# Patient Record
Sex: Female | Born: 1955 | Race: White | Hispanic: No | Marital: Married | Smoking: Never smoker
Health system: Southern US, Community
[De-identification: ages and names within clinical notes are randomized; demographics above are authoritative.]

## PROBLEM LIST (undated history)

## (undated) DIAGNOSIS — Z923 Personal history of irradiation: Secondary | ICD-10-CM

## (undated) DIAGNOSIS — K219 Gastro-esophageal reflux disease without esophagitis: Secondary | ICD-10-CM

## (undated) DIAGNOSIS — I10 Essential (primary) hypertension: Secondary | ICD-10-CM

## (undated) DIAGNOSIS — M8580 Other specified disorders of bone density and structure, unspecified site: Secondary | ICD-10-CM

## (undated) DIAGNOSIS — L719 Rosacea, unspecified: Secondary | ICD-10-CM

## (undated) DIAGNOSIS — C801 Malignant (primary) neoplasm, unspecified: Secondary | ICD-10-CM

## (undated) DIAGNOSIS — E785 Hyperlipidemia, unspecified: Secondary | ICD-10-CM

## (undated) DIAGNOSIS — K222 Esophageal obstruction: Secondary | ICD-10-CM

## (undated) HISTORY — DX: Gastro-esophageal reflux disease without esophagitis: K21.9

## (undated) HISTORY — PX: BREAST EXCISIONAL BIOPSY: SUR124

## (undated) HISTORY — DX: Essential (primary) hypertension: I10

## (undated) HISTORY — DX: Esophageal obstruction: K22.2

## (undated) HISTORY — DX: Rosacea, unspecified: L71.9

## (undated) HISTORY — DX: Other specified disorders of bone density and structure, unspecified site: M85.80

## (undated) HISTORY — DX: Malignant (primary) neoplasm, unspecified: C80.1

## (undated) HISTORY — PX: BREAST BIOPSY: SHX20

## (undated) HISTORY — DX: Hyperlipidemia, unspecified: E78.5

---

## 2000-05-12 DIAGNOSIS — C801 Malignant (primary) neoplasm, unspecified: Secondary | ICD-10-CM

## 2000-05-12 HISTORY — DX: Malignant (primary) neoplasm, unspecified: C80.1

## 2001-05-12 HISTORY — PX: CHOLECYSTECTOMY: SHX55

## 2001-06-17 ENCOUNTER — Encounter (INDEPENDENT_AMBULATORY_CARE_PROVIDER_SITE_OTHER): Payer: Self-pay | Admitting: Specialist

## 2001-06-17 ENCOUNTER — Inpatient Hospital Stay (HOSPITAL_COMMUNITY): Admission: EM | Admit: 2001-06-17 | Discharge: 2001-06-18 | Payer: Self-pay | Admitting: Emergency Medicine

## 2001-06-17 ENCOUNTER — Encounter: Payer: Self-pay | Admitting: General Surgery

## 2002-03-22 ENCOUNTER — Encounter: Payer: Self-pay | Admitting: Obstetrics and Gynecology

## 2002-03-22 ENCOUNTER — Encounter: Admission: RE | Admit: 2002-03-22 | Discharge: 2002-03-22 | Payer: Self-pay | Admitting: Obstetrics and Gynecology

## 2002-03-22 ENCOUNTER — Encounter (INDEPENDENT_AMBULATORY_CARE_PROVIDER_SITE_OTHER): Payer: Self-pay | Admitting: *Deleted

## 2002-04-11 ENCOUNTER — Encounter: Payer: Self-pay | Admitting: General Surgery

## 2002-04-11 ENCOUNTER — Ambulatory Visit (HOSPITAL_COMMUNITY): Admission: RE | Admit: 2002-04-11 | Discharge: 2002-04-11 | Payer: Self-pay | Admitting: General Surgery

## 2002-04-12 ENCOUNTER — Encounter: Payer: Self-pay | Admitting: General Surgery

## 2002-04-14 ENCOUNTER — Encounter: Payer: Self-pay | Admitting: Obstetrics and Gynecology

## 2002-04-14 ENCOUNTER — Encounter: Admission: RE | Admit: 2002-04-14 | Discharge: 2002-04-14 | Payer: Self-pay | Admitting: Obstetrics and Gynecology

## 2002-04-14 ENCOUNTER — Encounter (INDEPENDENT_AMBULATORY_CARE_PROVIDER_SITE_OTHER): Payer: Self-pay | Admitting: Specialist

## 2002-05-12 HISTORY — PX: BREAST SURGERY: SHX581

## 2002-05-18 ENCOUNTER — Encounter: Admission: RE | Admit: 2002-05-18 | Discharge: 2002-05-18 | Payer: Self-pay | Admitting: General Surgery

## 2002-05-18 ENCOUNTER — Encounter (INDEPENDENT_AMBULATORY_CARE_PROVIDER_SITE_OTHER): Payer: Self-pay | Admitting: *Deleted

## 2002-05-18 ENCOUNTER — Encounter: Payer: Self-pay | Admitting: General Surgery

## 2002-05-18 ENCOUNTER — Ambulatory Visit (HOSPITAL_COMMUNITY): Admission: RE | Admit: 2002-05-18 | Discharge: 2002-05-18 | Payer: Self-pay | Admitting: General Surgery

## 2003-02-21 ENCOUNTER — Encounter: Admission: RE | Admit: 2003-02-21 | Discharge: 2003-02-21 | Payer: Self-pay | Admitting: General Surgery

## 2003-02-21 ENCOUNTER — Encounter: Payer: Self-pay | Admitting: General Surgery

## 2003-04-20 ENCOUNTER — Encounter (HOSPITAL_COMMUNITY): Admission: RE | Admit: 2003-04-20 | Discharge: 2003-07-19 | Payer: Self-pay | Admitting: General Surgery

## 2004-02-22 ENCOUNTER — Encounter: Admission: RE | Admit: 2004-02-22 | Discharge: 2004-02-22 | Payer: Self-pay | Admitting: General Surgery

## 2004-04-16 ENCOUNTER — Encounter: Admission: RE | Admit: 2004-04-16 | Discharge: 2004-04-16 | Payer: Self-pay | Admitting: General Surgery

## 2004-05-24 ENCOUNTER — Ambulatory Visit: Payer: Self-pay | Admitting: Oncology

## 2005-04-01 ENCOUNTER — Encounter: Admission: RE | Admit: 2005-04-01 | Discharge: 2005-04-01 | Payer: Self-pay | Admitting: General Surgery

## 2005-04-26 ENCOUNTER — Encounter: Admission: RE | Admit: 2005-04-26 | Discharge: 2005-04-26 | Payer: Self-pay | Admitting: General Surgery

## 2005-05-15 ENCOUNTER — Ambulatory Visit: Payer: Self-pay | Admitting: Oncology

## 2006-04-06 ENCOUNTER — Encounter: Admission: RE | Admit: 2006-04-06 | Discharge: 2006-04-06 | Payer: Self-pay | Admitting: Oncology

## 2006-04-06 ENCOUNTER — Encounter: Admission: RE | Admit: 2006-04-06 | Discharge: 2006-04-06 | Payer: Self-pay | Admitting: Obstetrics and Gynecology

## 2006-05-11 ENCOUNTER — Ambulatory Visit: Payer: Self-pay | Admitting: Oncology

## 2006-05-15 LAB — CBC WITH DIFFERENTIAL/PLATELET
BASO%: 0.5 % (ref 0.0–2.0)
Basophils Absolute: 0 10*3/uL (ref 0.0–0.1)
EOS%: 2.2 % (ref 0.0–7.0)
Eosinophils Absolute: 0.1 10*3/uL (ref 0.0–0.5)
HCT: 37.9 % (ref 34.8–46.6)
HGB: 12.6 g/dL (ref 11.6–15.9)
LYMPH%: 25.8 % (ref 14.0–48.0)
MCH: 29.8 pg (ref 26.0–34.0)
MCHC: 33.2 g/dL (ref 32.0–36.0)
MCV: 89.6 fL (ref 81.0–101.0)
MONO#: 0.5 10*3/uL (ref 0.1–0.9)
MONO%: 7.9 % (ref 0.0–13.0)
NEUT#: 4.1 10*3/uL (ref 1.5–6.5)
NEUT%: 63.6 % (ref 39.6–76.8)
Platelets: 277 10*3/uL (ref 145–400)
RBC: 4.23 10*6/uL (ref 3.70–5.32)
RDW: 14.4 % (ref 11.3–14.5)
WBC: 6.4 10*3/uL (ref 3.9–10.0)
lymph#: 1.6 10*3/uL (ref 0.9–3.3)

## 2006-05-15 LAB — COMPREHENSIVE METABOLIC PANEL
ALT: 19 U/L (ref 0–35)
AST: 15 U/L (ref 0–37)
Albumin: 4.2 g/dL (ref 3.5–5.2)
Alkaline Phosphatase: 78 U/L (ref 39–117)
BUN: 15 mg/dL (ref 6–23)
CO2: 29 mEq/L (ref 19–32)
Calcium: 9.3 mg/dL (ref 8.4–10.5)
Chloride: 103 mEq/L (ref 96–112)
Creatinine, Ser: 0.91 mg/dL (ref 0.40–1.20)
Glucose, Bld: 113 mg/dL — ABNORMAL HIGH (ref 70–99)
Potassium: 3.7 mEq/L (ref 3.5–5.3)
Sodium: 141 mEq/L (ref 135–145)
Total Bilirubin: 0.4 mg/dL (ref 0.3–1.2)
Total Protein: 7.2 g/dL (ref 6.0–8.3)

## 2006-05-15 LAB — LACTATE DEHYDROGENASE: LDH: 113 U/L (ref 94–250)

## 2007-04-16 ENCOUNTER — Encounter: Admission: RE | Admit: 2007-04-16 | Discharge: 2007-04-16 | Payer: Self-pay | Admitting: Oncology

## 2007-05-19 ENCOUNTER — Ambulatory Visit: Payer: Self-pay | Admitting: Oncology

## 2007-05-21 LAB — COMPREHENSIVE METABOLIC PANEL
ALT: 14 U/L (ref 0–35)
AST: 13 U/L (ref 0–37)
Albumin: 4.2 g/dL (ref 3.5–5.2)
Alkaline Phosphatase: 77 U/L (ref 39–117)
BUN: 11 mg/dL (ref 6–23)
CO2: 26 mEq/L (ref 19–32)
Calcium: 9.4 mg/dL (ref 8.4–10.5)
Chloride: 103 mEq/L (ref 96–112)
Creatinine, Ser: 0.75 mg/dL (ref 0.40–1.20)
Glucose, Bld: 80 mg/dL (ref 70–99)
Potassium: 3.9 mEq/L (ref 3.5–5.3)
Sodium: 139 mEq/L (ref 135–145)
Total Bilirubin: 0.4 mg/dL (ref 0.3–1.2)
Total Protein: 7.4 g/dL (ref 6.0–8.3)

## 2007-05-21 LAB — CBC WITH DIFFERENTIAL/PLATELET
BASO%: 0.3 % (ref 0.0–2.0)
Basophils Absolute: 0 10*3/uL (ref 0.0–0.1)
EOS%: 2.5 % (ref 0.0–7.0)
Eosinophils Absolute: 0.2 10*3/uL (ref 0.0–0.5)
HCT: 40.2 % (ref 34.8–46.6)
HGB: 13.5 g/dL (ref 11.6–15.9)
LYMPH%: 30.8 % (ref 14.0–48.0)
MCH: 30.5 pg (ref 26.0–34.0)
MCHC: 33.6 g/dL (ref 32.0–36.0)
MCV: 90.7 fL (ref 81.0–101.0)
MONO#: 0.5 10*3/uL (ref 0.1–0.9)
MONO%: 7.5 % (ref 0.0–13.0)
NEUT#: 4.2 10*3/uL (ref 1.5–6.5)
NEUT%: 58.9 % (ref 39.6–76.8)
Platelets: 302 10*3/uL (ref 145–400)
RBC: 4.43 10*6/uL (ref 3.70–5.32)
RDW: 13.4 % (ref 11.3–14.5)
WBC: 7.1 10*3/uL (ref 3.9–10.0)
lymph#: 2.2 10*3/uL (ref 0.9–3.3)

## 2007-05-21 LAB — LACTATE DEHYDROGENASE: LDH: 125 U/L (ref 94–250)

## 2008-04-21 ENCOUNTER — Encounter: Admission: RE | Admit: 2008-04-21 | Discharge: 2008-04-21 | Payer: Self-pay | Admitting: Oncology

## 2008-04-26 ENCOUNTER — Encounter: Admission: RE | Admit: 2008-04-26 | Discharge: 2008-04-26 | Payer: Self-pay | Admitting: Oncology

## 2008-05-26 ENCOUNTER — Ambulatory Visit: Payer: Self-pay | Admitting: Oncology

## 2009-04-27 ENCOUNTER — Encounter: Admission: RE | Admit: 2009-04-27 | Discharge: 2009-04-27 | Payer: Self-pay | Admitting: Obstetrics and Gynecology

## 2009-05-16 ENCOUNTER — Ambulatory Visit: Payer: Self-pay | Admitting: Oncology

## 2009-05-18 LAB — CBC WITH DIFFERENTIAL/PLATELET
BASO%: 0.6 % (ref 0.0–2.0)
Basophils Absolute: 0 10*3/uL (ref 0.0–0.1)
EOS%: 1.8 % (ref 0.0–7.0)
Eosinophils Absolute: 0.1 10*3/uL (ref 0.0–0.5)
HCT: 40.3 % (ref 34.8–46.6)
HGB: 13.7 g/dL (ref 11.6–15.9)
LYMPH%: 31.8 % (ref 14.0–49.7)
MCH: 31.6 pg (ref 25.1–34.0)
MCHC: 34 g/dL (ref 31.5–36.0)
MCV: 92.9 fL (ref 79.5–101.0)
MONO#: 0.5 10*3/uL (ref 0.1–0.9)
MONO%: 7.4 % (ref 0.0–14.0)
NEUT#: 3.5 10*3/uL (ref 1.5–6.5)
NEUT%: 58.4 % (ref 38.4–76.8)
Platelets: 270 10*3/uL (ref 145–400)
RBC: 4.34 10*6/uL (ref 3.70–5.45)
RDW: 13.2 % (ref 11.2–14.5)
WBC: 6.1 10*3/uL (ref 3.9–10.3)
lymph#: 1.9 10*3/uL (ref 0.9–3.3)

## 2009-05-18 LAB — COMPREHENSIVE METABOLIC PANEL
ALT: 20 U/L (ref 0–35)
AST: 20 U/L (ref 0–37)
Albumin: 4.3 g/dL (ref 3.5–5.2)
Alkaline Phosphatase: 73 U/L (ref 39–117)
BUN: 12 mg/dL (ref 6–23)
CO2: 25 mEq/L (ref 19–32)
Calcium: 9.3 mg/dL (ref 8.4–10.5)
Chloride: 103 mEq/L (ref 96–112)
Creatinine, Ser: 0.88 mg/dL (ref 0.40–1.20)
Glucose, Bld: 84 mg/dL (ref 70–99)
Potassium: 3.9 mEq/L (ref 3.5–5.3)
Sodium: 141 mEq/L (ref 135–145)
Total Bilirubin: 0.3 mg/dL (ref 0.3–1.2)
Total Protein: 7.2 g/dL (ref 6.0–8.3)

## 2009-05-18 LAB — LACTATE DEHYDROGENASE: LDH: 123 U/L (ref 94–250)

## 2010-05-16 ENCOUNTER — Encounter
Admission: RE | Admit: 2010-05-16 | Discharge: 2010-05-16 | Payer: Self-pay | Source: Home / Self Care | Attending: Oncology | Admitting: Oncology

## 2010-05-17 ENCOUNTER — Encounter
Admission: RE | Admit: 2010-05-17 | Discharge: 2010-05-17 | Payer: Self-pay | Source: Home / Self Care | Attending: Oncology | Admitting: Oncology

## 2010-05-22 ENCOUNTER — Ambulatory Visit: Payer: Self-pay | Admitting: Oncology

## 2010-05-24 LAB — LACTATE DEHYDROGENASE: LDH: 113 U/L (ref 94–250)

## 2010-05-24 LAB — COMPREHENSIVE METABOLIC PANEL
ALT: 16 U/L (ref 0–35)
AST: 16 U/L (ref 0–37)
Albumin: 4.6 g/dL (ref 3.5–5.2)
Alkaline Phosphatase: 73 U/L (ref 39–117)
BUN: 18 mg/dL (ref 6–23)
CO2: 27 mEq/L (ref 19–32)
Calcium: 9.5 mg/dL (ref 8.4–10.5)
Chloride: 103 mEq/L (ref 96–112)
Creatinine, Ser: 0.99 mg/dL (ref 0.40–1.20)
Glucose, Bld: 97 mg/dL (ref 70–99)
Potassium: 3.8 mEq/L (ref 3.5–5.3)
Sodium: 140 mEq/L (ref 135–145)
Total Bilirubin: 0.3 mg/dL (ref 0.3–1.2)
Total Protein: 7.2 g/dL (ref 6.0–8.3)

## 2010-05-24 LAB — CBC WITH DIFFERENTIAL/PLATELET
BASO%: 1.2 % (ref 0.0–2.0)
Basophils Absolute: 0.1 10*3/uL (ref 0.0–0.1)
EOS%: 1.3 % (ref 0.0–7.0)
Eosinophils Absolute: 0.1 10*3/uL (ref 0.0–0.5)
HCT: 39.6 % (ref 34.8–46.6)
HGB: 13.6 g/dL (ref 11.6–15.9)
LYMPH%: 23.7 % (ref 14.0–49.7)
MCH: 32 pg (ref 25.1–34.0)
MCHC: 34.5 g/dL (ref 31.5–36.0)
MCV: 92.7 fL (ref 79.5–101.0)
MONO#: 0.4 10*3/uL (ref 0.1–0.9)
MONO%: 4.9 % (ref 0.0–14.0)
NEUT#: 5.3 10*3/uL (ref 1.5–6.5)
NEUT%: 68.9 % (ref 38.4–76.8)
Platelets: 255 10*3/uL (ref 145–400)
RBC: 4.27 10*6/uL (ref 3.70–5.45)
RDW: 12.9 % (ref 11.2–14.5)
WBC: 7.6 10*3/uL (ref 3.9–10.3)
lymph#: 1.8 10*3/uL (ref 0.9–3.3)

## 2010-06-02 ENCOUNTER — Encounter: Payer: Self-pay | Admitting: Oncology

## 2010-06-21 ENCOUNTER — Encounter (HOSPITAL_BASED_OUTPATIENT_CLINIC_OR_DEPARTMENT_OTHER): Payer: 59 | Admitting: Oncology

## 2010-06-21 DIAGNOSIS — Z853 Personal history of malignant neoplasm of breast: Secondary | ICD-10-CM

## 2010-06-25 ENCOUNTER — Other Ambulatory Visit: Payer: Self-pay | Admitting: Oncology

## 2010-06-25 DIAGNOSIS — Z1231 Encounter for screening mammogram for malignant neoplasm of breast: Secondary | ICD-10-CM

## 2010-09-27 NOTE — H&P (Signed)
[facility]  Patient:    Tina [name], Tina [name] Visit Number: [id number] MRN: [id number]          Service Type: SUR Location: 3W 0343 02 Attending Physician:  Arlis Porta Dictated by:   Adolph Pollack, M.D. Admit Date:  06/17/2001   CC:         Lesle Chris, M.D., [facility] [address]   History and Physical  CHIEF COMPLAINT:  Right upper quadrant pain with nausea.  HISTORY OF PRESENT ILLNESS:  This 55 year old female awoke at 2 oclock this morning with some mild right upper quadrant pain that progressively worsened, felt like it was "boring" through to her back and associated with nausea.  The pain did not let up and she presented six hours later to urgent care for evaluation.  While she was there they sent her over for a stat ultrasound, and it demonstrated thickened gallbladder wall with a large 2 cm gallstone impacted in the neck of the gallbladder.  She was given a shot of Toradol and Phenergan and presented to the emergency department.  She did get some relief from the Toradol and Phenergan but not complete.  She states she had something like this two years ago, but it was self-limited.  She denies fever or chills with jaundice.  PAST MEDICAL HISTORY:  Allergic rhinitis.  PREVIOUS OPERATIONS:  None.  ALLERGIES:  None known.  MEDICATIONS:  Clarinex and birth control pill.  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  She is married and has two children.  No tobacco use.  She occasionally has an alcoholic beverage.  No recreational drug use.  FAMILY HISTORY:  Positive for father who has type 2 diabetes mellitus, coronary artery disease, and hypertension.  Mother had uterine cancer, hypertension, does have cholelithiasis, but it is asymptomatic.  REVIEW OF SYSTEMS:  CARDIOVASCULAR:  No known heart disease, hypertension. PULMONARY:  No COPD, pneumonia, TB.  GASTROINTESTINAL:  No peptic ulcer disease,  hepatitis, diverticulitis.  GENITOURINARY:  No kidney stones. NEUROLOGIC:  No seizure.  HEMATOLOGIC:  No bleeding disorders or previous transfusions.  No DVT.  PHYSICAL EXAMINATION:  GENERAL:  Generally a slightly ill-appearing female, but who is very pleasant and cooperative.  VITAL SIGNS:  Temperature is 98.1 with blood pressure 134/84 and a pulse of 85.  SKIN:  Warm and dry without jaundice.  HEENT:  Eyes:  Extraocular motion is intact.  No icterus.  NECK:  Supple without masses.  CARDIOVASCULAR:  _________ is regular rate and rhythm without murmur.  RESPIRATORY:  Breath sounds are clear.  Respirations _________ .  ABDOMEN:  Soft with right upper quadrant tenderness and guarding.  No palpable masses.  No organomegaly present.  MUSCULOSKELETAL:  Full range of motion with no cyanosis or edema.  LABORATORY DATA:  Liver function tests within normal limits, as is her entire CMET.  Urine pregnancy is negative.  White blood cell count from Dr. Deforest Hoyles office is 9900.  Urinalysis unremarkable.  IMPRESSION:  Acute cholecystitis with early evolution at this time.  PLAN: 1. IV antibiotics. 2. Laparoscopic cholecystectomy.  I did explain procedure and the risks to include, but not limited to, bleeding, infection, common bile duct injury, hepatic injury with bile leak, intestinal injury, and the risk of general anesthesia.  She seems to understand these and agrees to proceed. Dictated by:   Adolph Pollack, M.D. Attending Physician:  Arlis Porta DD:  06/17/01 TD:  06/17/01 Job: [id number]

## 2010-09-27 NOTE — Op Note (Signed)
NAME:  Tina [name], Tina [name]                          ACCOUNT NO.:  [id number]   MEDICAL RECORD NO.:  [id number]                   PATIENT TYPE:  OIB   LOCATION:  2892                                 FACILITY:  [facility]   PHYSICIAN:  Angelia Mould. Derrell Lolling, M.D.             DATE OF BIRTH:  1955/06/16   DATE OF PROCEDURE:  05/18/2002  DATE OF DISCHARGE:  05/18/2002                                 OPERATIVE REPORT   PREOPERATIVE DIAGNOSES:  1. Lobular carcinoma in situ, left breast.  2. Right breast mass, sclerosing papillary lesion.   POSTOPERATIVE DIAGNOSES:  1. Lobular carcinoma in situ, left breast.  2. Right breast mass, sclerosing papillary lesion.   OPERATION PERFORMED:  Excisional biopsy of right breast mass with needle  localization.   SPECIMENS:  Mammogram.   SURGEON:  Angelia Mould. Derrell Lolling, M.D.   INDICATIONS FOR PROCEDURE:  This is a 55 year old white female who initially  was sent to me several weeks ago with a tiny focus of microcalcifications at  the 12 o'clock position of the left breast. Core biopsy of this lesion  showed lobular carcinoma in situ and there was on residual calcifications  following this procedure. The patient is being considered for hormonal  manipulation therapy and she has been advised against any further operation.  She was concerned and wanted further evaluation of her breast. MRI of both  breasts were performed. The MRI of the left breast was normal but the MRI of  the right breast showed a tiny nodule at the 7 o'clock position of the right  breast. Ultrasound guided core biopsy of this by Dr. Rosalie Gums showed a  sclerosing papillary lesion and there was no atypia but there was associated  ductal hyperplasia and the pathologist felt that the entire lesion should be  excised and I agreed. The patient strongly wanted to have both areas  excised, the one on the right and the one on the left. That was scheduled  but when she went for her localization today,  Dr. Rosalie Gums stated that  there was no residual calcifications and there was no marker in the left  breast and so she could not localize the area on the left breast. Therefore  the left breast biopsy was cancelled but she did perform a wide localization  of the right breast lesion at the 7 o'clock position and that localization  was satisfactory.   TECHNIQUE:  Following the wide localization, the patient was brought to [facility]. Films were reviewed. She was taken to the operating room where a  general LMA anesthetic was introduced. The right breast was prepped and  draped in a sterile fashion. The 0.5% Marcaine with epinephrine was used as  a local infiltration anesthetic. I made a radially oriented incision in the  right breast starting at the areolar margin and extending down in the 7  o'clock position. We dissected down into  the breast tissue, isolated the  wire and brought that into the center of the wound. We then dissected around  the wire at the 7 o'clock position dissecting all the breast tissue. We  removed an area about the size of a golf ball but were slightly smaller.  This was marked with metal markers to orient the radiologist and the  pathologist. The specimen was sent for specimen mammography and Dr. Manson Passey  stated that we had removed the entire abnormal area. The specimen was then  sent to pathology. Hemostasis was excellent and assured with electrocautery.  The wound was irrigated with saline. The skin was closed with a running  subcuticular  suture of 4-0 Vicryl and Steri-Strips. Clean bandages were placed and the  patient taken to the recovery room in stable condition. Estimated blood loss  was about 15 cc. Complications none. Sponge, needle and instrument counts  were correct.                                                Angelia Mould. Derrell Lolling, M.D.    HMI/MEDQ  D:  05/18/2002  T:  05/18/2002  Job:  [id number]   cc:   S. Kyra Manges, M.D.  [phone] [address]  [place]  [place] [address]  Fax: [phone]   Genene Churn. Cyndie Chime, M.D.  [address] [facility]  [place]  [place] [address]  Fax: [phone]

## 2010-09-27 NOTE — Op Note (Signed)
[facility]  Patient:    Tina [name], Tina [name] Visit Number: [id number] MRN: [id number]          Service Type: SUR Location: 3W 0343 02 Attending Physician:  Arlis Porta Dictated by:   Adolph Pollack, M.D. Proc. Date: 06/17/01 Admit Date:  06/17/2001 Discharge Date: 06/18/2001                             Operative Report  PREOPERATIVE DIAGNOSIS:  Acute cholecystitis.  POSTOPERATIVE DIAGNOSIS:  Acute cholecystitis.  OPERATION PERFORMED:  Laparoscopic cholecystectomy.  SURGEON:  Adolph Pollack, M.D.  ASSISTANT:  Sheppard Plumber. Earlene Plater, M.D.  ANESTHESIA:  General.  INDICATIONS FOR PROCEDURE:  The patient is a 55 year old female with severe right upper quadrant pain and guarding on exam.  Ultrasound demonstrates a thickened gallbladder wall with a 2 cm gallstone impacted in the neck of the gallbladder.  She now presents to the operating room.  DESCRIPTION OF PROCEDURE:  She was placed supine on the operating table and a general anesthetic was administered.  The abdomen was sterilely prepped and draped.  Local anesthetic was infiltrated in the subumbilical region and a small subumbilical scar was made in the skin and subcutaneous tissue.  The midline fascia was identified and incision made in the midline fascia.  The peritoneal cavity was entered bluntly and under direct vision.  A pursestring suture of 0 Vicryl was placed around the fascial edges.  A Hasson trocar was introduced into the peritoneal cavity and pneumoperitoneum created by insufflation of CO2 gas.  Next, a laparoscope was introduced and no underlying bowel injury was noted. The patient was placed in the appropriate position and an 11 mm incision made in the epigastric area through which a similar sized trocar was placed into the peritoneal cavity.  Two 5 mm incisions were made and two 5 mm trocars placed into the peritoneal cavity.  The gallbladder was acutely inflamed  and distended and was evacuated.  Next, the fundus was able to be grasped and retracted to the right shoulder.  The infundibulum was grasped and then completely mobilized.  The cystic duct was identified and isolated at its junction with the gallbladder.  A window was created around the cystic duct. It was clipped three times proximally, once distally and divided.  The cystic artery was identified, clipped and divided.  The gallbladder was dissected free from the liver bed with electrocautery.  There appeared to be a small accessory bile duct and this was clipped and divided up near the lateral portion of the gallbladder fossa.  Next, the liver bed was irrigated and bleeding points were controlled with the cautery and with a piece of Surgicel.  The irrigation fluid was then evacuated.  No further bleeding and no bile leakage was noted.  The gallbladder was placed in an endopouch bag and removed through the subumbilical port.  The subumbilical fascial defect was closed by tightening up and tying down the pursestring suture.  The rest of the irrigation fluid was then evacuated and all trocars were removed and the pneumoperitoneum released.  The skin incisions were closed with 4-0 Monocryl subcuticular stitches.  Steri-Strips and sterile dressings were applied.  The patient tolerated the procedure well without any apparent complications and was taken to the recovery room in satisfactory condition. Dictated by:   Adolph Pollack, M.D. Attending Physician:  Arlis Porta DD:  06/17/01 TD:  06/18/01 Job: [id number]

## 2011-01-21 ENCOUNTER — Other Ambulatory Visit (INDEPENDENT_AMBULATORY_CARE_PROVIDER_SITE_OTHER): Payer: Self-pay

## 2011-01-21 ENCOUNTER — Telehealth (INDEPENDENT_AMBULATORY_CARE_PROVIDER_SITE_OTHER): Payer: Self-pay

## 2011-01-21 DIAGNOSIS — N6452 Nipple discharge: Secondary | ICD-10-CM

## 2011-01-21 NOTE — Telephone Encounter (Signed)
Pt called c/o dark  Brown nipple d/c right breast. Hx LCIS left breast and hyperplagia of right breast several years ago. Pt called [facility] and they advised she needs order for dx imaging to be placed before they could schedule imaging. Pt advised we will place order.

## 2011-01-27 ENCOUNTER — Ambulatory Visit
Admission: RE | Admit: 2011-01-27 | Discharge: 2011-01-27 | Disposition: A | Discharge status: Home / Self Care | Payer: 59 | Source: Ambulatory Visit | Attending: General Surgery | Admitting: General Surgery

## 2011-01-27 ENCOUNTER — Telehealth (INDEPENDENT_AMBULATORY_CARE_PROVIDER_SITE_OTHER): Payer: Self-pay | Admitting: General Surgery

## 2011-01-27 DIAGNOSIS — N6452 Nipple discharge: Secondary | ICD-10-CM

## 2011-01-27 NOTE — Telephone Encounter (Signed)
LM on patients work and Mobile numbers to call for results of mammo and [place].

## 2011-02-03 ENCOUNTER — Encounter (INDEPENDENT_AMBULATORY_CARE_PROVIDER_SITE_OTHER): Payer: Self-pay | Admitting: General Surgery

## 2011-02-03 ENCOUNTER — Ambulatory Visit (INDEPENDENT_AMBULATORY_CARE_PROVIDER_SITE_OTHER): Payer: 59 | Admitting: General Surgery

## 2011-02-03 VITALS — BP 120/78 | HR 64 | Temp 97.4°F | Resp 16 | Ht 67.0 in | Wt 193.5 lb

## 2011-02-03 DIAGNOSIS — N6452 Nipple discharge: Secondary | ICD-10-CM

## 2011-02-03 DIAGNOSIS — N6459 Other signs and symptoms in breast: Secondary | ICD-10-CM

## 2011-02-03 NOTE — Patient Instructions (Signed)
Your breast exam today is normal. Your mammograms are normal. I think that the drainage that you experienced in the right breast when you pressed on the nipple is a low risk finding. I advise no surgery at this time. I advise mammograms in January 2013. Return to see me if there is an abnormality on mammogram or if you develop spontaneous nipple discharge.

## 2011-02-03 NOTE — Progress Notes (Signed)
Chief Complaint  Patient presents with  . Other    eval of abnormal nipple discharge- right breast painful and draining     HPI Tina [name] is a 55 y.o. female.    This is a very pleasant patient that I have seen in the past. In 2004 she had abnormal breast calcifications. She underwent image guided biopsy of the left breast which showed lobular carcinoma in situ. We were going to excise abnormalities in the right and the left breast, but when she went for needle localization they could not find any abnormality in the left. We abandoned the left breast biopsy. We proceeded with a right breast biopsy which showed florid hyperplasia. I felt she was high risk patient and she has been followed by j Dr. Cyndie Chime since that time. She did receives annual mammograms and  she receives an MRI every other year until recently. There has never been other abnormality.  She recently developed right nipple discharge. She says this occurs only when she would press on the breast and the nipple area. It has not been spontaneous. There has not been any discharge onto her bra.. She does not have any pain. She does not feel a mass.  She went to the [facility] [place] Her mammograms showd no abnormality. She was advised to have followup bilateral mammograms in January. HPI  Past Medical History  Diagnosis Date  . Hypertension   . Breast discharge     dark bloody discharge   . Breast pain     Past Surgical History  Procedure Date  . Cholecystectomy 2003  . Breast surgery 2004    excisionla right breast surgery     Family History  Problem Relation Age of Onset  . Stroke Father 24    Social History History  Substance Use Topics  . Smoking status: Never Smoker   . Smokeless tobacco: Never Used  . Alcohol Use: Yes    No Known Allergies  Current Outpatient Prescriptions  Medication Sig Dispense Refill  . COZAAR 50 MG tablet       . LOVAZA 1 G capsule 4 mg daily.         Review of  Systems Review of Systems  12 system review of systems is performed and is negative except as described above.  Blood pressure 120/78, pulse 64, temperature 97.4 F (36.3 C), resp. rate 16, height 5\' 7"  (1.702 m), weight 193 lb 8 oz (87.771 kg).  Physical Exam Physical Exam  Constitutional: She is oriented to person, place, and time. She appears well-developed and well-nourished. No distress.  HENT:  Head: Normocephalic and atraumatic.  Nose: Nose normal.  Mouth/Throat: No oropharyngeal exudate.  Eyes: Conjunctivae are normal. Pupils are equal, round, and reactive to light. Left eye exhibits no discharge. No scleral icterus.  Neck: Normal range of motion. Neck supple. No JVD present. No tracheal deviation present. No thyromegaly present.  Cardiovascular: Normal rate, regular rhythm, normal heart sounds and intact distal pulses.   No murmur heard. Pulmonary/Chest: Effort normal and breath sounds normal. No respiratory distress. She has no wheezes. She has no rales.    Abdominal: Soft. Bowel sounds are normal. She exhibits no distension and no mass. There is no tenderness. There is no rebound and no guarding.  Musculoskeletal: Normal range of motion. She exhibits no edema and no tenderness.  Lymphadenopathy:    She has no cervical adenopathy.  Neurological: She is alert and oriented to person, place, and time. She exhibits  normal muscle tone. Coordination normal.  Skin: Skin is warm and dry. No rash noted. She is not diaphoretic. No erythema. No pallor.  Psychiatric: She has a normal mood and affect. Her behavior is normal. Judgment and thought content normal.    Data Reviewed  I reviewed her mammograms and the mammogram report.  Assessment    Right nipple discharge, rust colored, nonspontaneous. Low-risk findings considering physical exam mammograms and history.  History lobular carcinoma in situ left breast.  Status post a cholecystectomy.    Plan    The patient was  reassured this is a low-risk finding.  Advised bilateral mammograms in January 2013.  She was advised to return to see me if there is any mammographic abnormality or if she develops recurrent nipple discharge which is spontaneous.       Kesean Serviss M 02/03/2011, 3:27 PM

## 2011-05-16 ENCOUNTER — Other Ambulatory Visit: Payer: 59 | Admitting: Lab

## 2011-05-23 ENCOUNTER — Ambulatory Visit: Payer: 59 | Admitting: Oncology

## 2011-05-28 ENCOUNTER — Ambulatory Visit
Admission: RE | Admit: 2011-05-28 | Discharge: 2011-05-28 | Disposition: A | Discharge status: Home / Self Care | Payer: 59 | Source: Ambulatory Visit | Attending: Oncology | Admitting: Oncology

## 2011-05-28 DIAGNOSIS — Z1231 Encounter for screening mammogram for malignant neoplasm of breast: Secondary | ICD-10-CM

## 2011-07-11 DIAGNOSIS — M8580 Other specified disorders of bone density and structure, unspecified site: Secondary | ICD-10-CM

## 2011-07-11 HISTORY — DX: Other specified disorders of bone density and structure, unspecified site: M85.80

## 2011-08-04 ENCOUNTER — Encounter: Payer: Self-pay | Admitting: Gynecology

## 2011-08-04 ENCOUNTER — Ambulatory Visit (INDEPENDENT_AMBULATORY_CARE_PROVIDER_SITE_OTHER): Payer: 59 | Admitting: Gynecology

## 2011-08-04 VITALS — BP 116/74 | Ht 67.5 in | Wt 185.0 lb

## 2011-08-04 DIAGNOSIS — Z01419 Encounter for gynecological examination (general) (routine) without abnormal findings: Secondary | ICD-10-CM

## 2011-08-04 DIAGNOSIS — Z780 Asymptomatic menopausal state: Secondary | ICD-10-CM

## 2011-08-04 DIAGNOSIS — Z131 Encounter for screening for diabetes mellitus: Secondary | ICD-10-CM

## 2011-08-04 DIAGNOSIS — Z13220 Encounter for screening for lipoid disorders: Secondary | ICD-10-CM

## 2011-08-04 LAB — COMPREHENSIVE METABOLIC PANEL
ALT: 16 U/L (ref 0–35)
AST: 19 U/L (ref 0–37)
Albumin: 4.7 g/dL (ref 3.5–5.2)
Alkaline Phosphatase: 63 U/L (ref 39–117)
BUN: 15 mg/dL (ref 6–23)
CO2: 29 mEq/L (ref 19–32)
Calcium: 10.1 mg/dL (ref 8.4–10.5)
Chloride: 104 mEq/L (ref 96–112)
Creat: 0.82 mg/dL (ref 0.50–1.10)
Glucose, Bld: 93 mg/dL (ref 70–99)
Potassium: 4 mEq/L (ref 3.5–5.3)
Sodium: 141 mEq/L (ref 135–145)
Total Bilirubin: 0.4 mg/dL (ref 0.3–1.2)
Total Protein: 7.5 g/dL (ref 6.0–8.3)

## 2011-08-04 LAB — LIPID PANEL
Cholesterol: 175 mg/dL (ref 0–200)
HDL: 42 mg/dL (ref >39)
LDL Cholesterol: 104 mg/dL — ABNORMAL HIGH (ref 0–99)
Total CHOL/HDL Ratio: 4.2 Ratio
Triglycerides: 146 mg/dL (ref <150)
VLDL: 29 mg/dL (ref 0–40)

## 2011-08-04 LAB — CBC WITH DIFFERENTIAL/PLATELET
Basophils Absolute: 0 10*3/uL (ref 0.0–0.1)
Basophils Relative: 0 % (ref 0–1)
Eosinophils Absolute: 0.1 10*3/uL (ref 0.0–0.7)
Eosinophils Relative: 1 % (ref 0–5)
HCT: 41.6 % (ref 36.0–46.0)
Hemoglobin: 13.9 g/dL (ref 12.0–15.0)
Lymphocytes Relative: 40 % (ref 12–46)
Lymphs Abs: 2.1 10*3/uL (ref 0.7–4.0)
MCH: 30.1 pg (ref 26.0–34.0)
MCHC: 33.4 g/dL (ref 30.0–36.0)
MCV: 90 fL (ref 78.0–100.0)
Monocytes Absolute: 0.4 10*3/uL (ref 0.1–1.0)
Monocytes Relative: 7 % (ref 3–12)
Neutro Abs: 2.8 10*3/uL (ref 1.7–7.7)
Neutrophils Relative %: 52 % (ref 43–77)
Platelets: 255 10*3/uL (ref 150–400)
RBC: 4.62 MIL/uL (ref 3.87–5.11)
RDW: 13.5 % (ref 11.5–15.5)
WBC: 5.3 10*3/uL (ref 4.0–10.5)

## 2011-08-04 NOTE — Patient Instructions (Signed)
Follow up for bone density as scheduled. Follow for lab results. Return in one year for her annual gynecologic exam.

## 2011-08-04 NOTE — Progress Notes (Signed)
Tina [name] Jul 23, 1955 [id number]        56 y.o.  New patient for annual exam.  Former patient of Dr. August Saucer [facility].  Past medical history,surgical history, medications, allergies, family history and social history were all reviewed and documented in the EPIC chart. ROS:  Was performed and pertinent positives and negatives are included in the history.  Exam: Sherrilyn Rist chaperone present Filed Vitals:   08/04/11 1035  BP: 116/74   General appearance  Normal Skin grossly normal Head/Neck normal with no cervical or supraclavicular adenopathy thyroid normal Lungs  clear Cardiac RR, without RMG Abdominal  soft, nontender, without masses, organomegaly or hernia Breasts  examined lying and sitting without masses, retractions, discharge or axillary adenopathy.  Well-healed excision scar right breast Pelvic  Ext/BUS/vagina  normal mild atrophic changes  Cervix  normal    Uterus  anteverted, normal size, shape and contour, midline and mobile nontender   Adnexa  Without masses or tenderness    Anus and perineum  normal   Rectovaginal  normal sphincter tone without palpated masses or tenderness.    Assessment/Plan:  56 y.o. female for annual exam.    1. Doing well from a gynecologic standpoint. No significant hot flushes sweats or other menopausal symptoms. 2. Pap smear. No Pap smear was done today. Her last Pap smear was 2012. She has no history of abnormal Pap smears before with multiple normal records in her chart. We'll plan every 3 year Pap smears and she agrees with this according to current screening guidelines. 3. Mammography. Patient recently had mammogram September 2012. Was evaluated for a darkish nipple discharge per Dr. Claud Kelp and it was felt to be benign. She has had no recurrence. She'll continue with SBE monthly. She will report any further discharge. She does have a history of lobular carcinoma in situ and receives annual mammography and every other year MRIs. Her last MRI  was last year and she'll repeat next year. 4. Colonoscopy. Patient had her colonoscopy last year. We'll repeat it at the recommended interval. 5. Bone density. She had a bone density in 2005. I ordered a repeat now with a maternal history of osteoporosis. Increase calcium vitamin D reviewed. We'll check baseline vitamin D level today. 6. Health maintenance. She normally saw Dr. Lelon Perla for her hypertension follow up and she understands that I do not treat this and she will need to establish care with a primary physician and she agrees to do so. Baseline CBC lipid profile comprehensive metabolic panel vitamin D and urinalysis ordered. She had a normal thyroid panel last year. She will see me in a year or sooner she continues well sooner as needed.    Dara Lords MD, 11:20 AM 08/04/2011

## 2011-08-05 LAB — URINALYSIS W MICROSCOPIC + REFLEX CULTURE
Bacteria, UA: NONE SEEN
Bilirubin Urine: NEGATIVE
Casts: NONE SEEN
Glucose, UA: NEGATIVE mg/dL
Hgb urine dipstick: NEGATIVE
Ketones, ur: NEGATIVE mg/dL
Leukocytes, UA: NEGATIVE
Nitrite: NEGATIVE
Protein, ur: NEGATIVE mg/dL
Specific Gravity, Urine: 1.019 (ref 1.005–1.030)
Squamous Epithelial / LPF: NONE SEEN
Urobilinogen, UA: 0.2 mg/dL (ref 0.0–1.0)
pH: 5.5 (ref 5.0–8.0)

## 2011-08-05 LAB — VITAMIN D 25 HYDROXY (VIT D DEFICIENCY, FRACTURES): Vit D, 25-Hydroxy: 46 ng/mL (ref 30–89)

## 2011-08-07 ENCOUNTER — Ambulatory Visit (INDEPENDENT_AMBULATORY_CARE_PROVIDER_SITE_OTHER): Payer: 59

## 2011-08-07 ENCOUNTER — Encounter: Payer: Self-pay | Admitting: Gynecology

## 2011-08-07 ENCOUNTER — Telehealth: Payer: Self-pay | Admitting: Gynecology

## 2011-08-07 DIAGNOSIS — M8580 Other specified disorders of bone density and structure, unspecified site: Secondary | ICD-10-CM

## 2011-08-07 DIAGNOSIS — M949 Disorder of cartilage, unspecified: Secondary | ICD-10-CM

## 2011-08-07 DIAGNOSIS — Z780 Asymptomatic menopausal state: Secondary | ICD-10-CM

## 2011-08-07 DIAGNOSIS — M899 Disorder of bone, unspecified: Secondary | ICD-10-CM

## 2011-08-07 NOTE — Telephone Encounter (Signed)
Patient informed.  Patient requested copy of labs and report. Mailed to patient.

## 2011-08-07 NOTE — Telephone Encounter (Signed)
Tell patient that her bone density showed a "touch" of osteopenia. Her vitamin D level was normal. Recommend weightbearing exercise which is not only good for her bones but her cardiovascular system and repeat the DEXA in 2 years.

## 2011-08-07 NOTE — Telephone Encounter (Signed)
Lm for patient call.

## 2011-10-19 ENCOUNTER — Ambulatory Visit (INDEPENDENT_AMBULATORY_CARE_PROVIDER_SITE_OTHER): Payer: 59 | Admitting: Family Medicine

## 2011-10-19 VITALS — BP 130/83 | HR 71 | Temp 97.9°F | Resp 16 | Ht 67.0 in | Wt 191.0 lb

## 2011-10-19 DIAGNOSIS — L989 Disorder of the skin and subcutaneous tissue, unspecified: Secondary | ICD-10-CM

## 2011-10-19 DIAGNOSIS — I10 Essential (primary) hypertension: Secondary | ICD-10-CM

## 2011-10-19 MED ORDER — LOSARTAN POTASSIUM 50 MG PO TABS: 50.0000 mg | ORAL_TABLET | Freq: Every day | ORAL | Status: DC

## 2011-10-19 NOTE — Progress Notes (Signed)
Subjective: Patient has a history of hypertension. Her previous physician retired. She established with another OB/GYN, but he did not do very care. Therefore she has done her research and supper, with a Dr. Clelia Croft for August. However she will run out of her medication soon on the Cozaar, so came in for a refill of that. She also takes Flonase for her triglycerides. They were checked in March and were good. She has not a new refill on that between now and seeing her new primary care.  Her other concern is that she has a complex of superficial spider of her those pains are left lateral thigh, but more recently has noticed a dark area about 5 mm in diameter on the proximal aspect of that spider varicosity. She's concerned about that. She does go to Dr. Terri Piedra for her dermatologist, but wants to know if there is something she should pay attention to her not.  Objective: Without nodes thyromegaly. Chest clear. Heart regular without murmurs. No ankle edema. Complexes spider veins on the left lateral thigh, with the lesion proximal that is round with a little bit of inactive appearing border. However this all looks like a palpable vascular your lesion.  Assessment: Hypertension Hyperlipidemia Pigmented lesion left thigh Varicose veins  Plan: Recommend she go ahead and see her dermatologist. Will re-prescribe her blood pressure medicine. Did not do any labs today pending her seeing her new primary care this August.

## 2011-10-19 NOTE — Patient Instructions (Signed)
See Dr. Terri Piedra to check the lesion on the leg.

## 2011-10-20 ENCOUNTER — Other Ambulatory Visit: Payer: Self-pay | Admitting: Dermatology

## 2012-02-01 ENCOUNTER — Ambulatory Visit (INDEPENDENT_AMBULATORY_CARE_PROVIDER_SITE_OTHER): Payer: 59 | Admitting: Physician Assistant

## 2012-02-01 VITALS — BP 128/78 | HR 62 | Temp 97.4°F | Resp 16 | Ht 68.18 in | Wt 193.4 lb

## 2012-02-01 DIAGNOSIS — E785 Hyperlipidemia, unspecified: Secondary | ICD-10-CM | POA: Insufficient documentation

## 2012-02-01 DIAGNOSIS — I10 Essential (primary) hypertension: Secondary | ICD-10-CM | POA: Insufficient documentation

## 2012-02-01 DIAGNOSIS — T63481A Toxic effect of venom of other arthropod, accidental (unintentional), initial encounter: Secondary | ICD-10-CM

## 2012-02-01 DIAGNOSIS — T6391XA Toxic effect of contact with unspecified venomous animal, accidental (unintentional), initial encounter: Secondary | ICD-10-CM

## 2012-02-01 MED ORDER — CEPHALEXIN 250 MG PO CAPS: 250.0000 mg | ORAL_CAPSULE | Freq: Three times a day (TID) | ORAL | Status: DC

## 2012-02-01 NOTE — Progress Notes (Signed)
Subjective:    Patient ID: Tina [name], female    DOB: [birth year], 56 y.o.   MRN: [id number]  HPI This 56 y.o. female presents for evaluation of swelling and redness of the right forearm after a presumed yellow jacket sting two days ago while trimming her mother's shrubs.  She did not see what stung her, but notes there was a nearby hole in the ground that she thinks may have a nest in it.  She marked the area of redness that developed, and remarked it yesterday evening when it spread beyond her marking.  This morning she noted one area distal to the injury, where the redness has progressed.  However, she also notes that overall the redness and swelling is less this morning than last night.  She denies respiratory symptoms.  She has no fever, chills, GI/GU symptoms.  Review of Systems  As above.  Past Medical History  Diagnosis Date  . Hypertension   . Breast discharge     dark bloody discharge   . Breast pain   . Abnormal breast tissue 2005    florid hyperplasia; precancerous tissue  . Osteopenia 07/2011    t score -1.1  . Hyperlipidemia     Past Surgical History  Procedure Date  . Cholecystectomy 2003  . Breast surgery 2004    excisionla right breast surgery     Prior to Admission medications   Medication Sig Start Date End Date Taking? Authorizing Provider  losartan (COZAAR) 50 MG tablet Take 1 tablet (50 mg total) by mouth daily. 10/19/11  Yes Peyton Najjar, MD  LOVAZA 1 G capsule 4 mg daily.  12/05/10  Yes Historical Provider, MD    No Known Allergies  History   Social History  . Marital Status: Married   Occupational History  . Not on file.   Social History Main Topics  . Smoking status: Never Smoker   . Smokeless tobacco: Never Used  . Alcohol Use: Yes     rarely  . Drug Use: No  . Sexually Active: Yes    Birth Control/ Protection: Post-menopausal    Family History  Problem Relation Age of Onset  . Stroke Father 13  . Diabetes Father   .  Hypertension Father   . Heart disease Father     bypass  . Cancer Father     parotid cancer  . Cancer Mother 26    uterine  . Osteoporosis Mother   . Diabetes Brother     type 2  . Cancer Maternal Uncle     colon  . Cancer Paternal Uncle     colon  . Diabetes Son 63    MODY       Objective:   Physical Exam  Blood pressure 128/78, pulse 62, temperature 97.4 F (36.3 C), temperature source Oral, resp. rate 16, height 5' 8.18" (1.732 m), weight 193 lb 6.4 oz (87.726 kg), last menstrual period 05/05/2010, SpO2 100.00%. Body mass index is 29.25 kg/(m^2). Well-developed, well nourished female who is awake, alert and oriented, in NAD. HEENT: Nordic/AT, sclera and conjunctiva are clear.   Lungs:normal effort Extremities: no cyanosis, clubbing. Edema noted surrounding a tiny wound of the forearm, extending about 12 x 6 cm. Skin: warm and dry with erythema surrounding a tiny wound of the forearm, consistent with hymenoptera sting. Non-tender.  No increased warmth.  No drainage.       Assessment & Plan:   1. Insect sting  cephALEXin (KEFLEX) 250 MG capsule  Patient Instructions  Use an OTC antihistamine daily to reduce the reaction and it is ok to apply a topical hydrocortisone cream or ointment.

## 2012-02-01 NOTE — Patient Instructions (Signed)
Use an OTC antihistamine daily to reduce the reaction and it is ok to apply a topical hydrocortisone cream or ointment.

## 2012-04-22 ENCOUNTER — Other Ambulatory Visit: Payer: Self-pay | Admitting: Gynecology

## 2012-04-22 DIAGNOSIS — Z1231 Encounter for screening mammogram for malignant neoplasm of breast: Secondary | ICD-10-CM

## 2012-06-09 ENCOUNTER — Ambulatory Visit
Admission: RE | Admit: 2012-06-09 | Discharge: 2012-06-09 | Disposition: A | Discharge status: Home / Self Care | Payer: 59 | Source: Ambulatory Visit | Attending: Gynecology | Admitting: Gynecology

## 2012-06-09 DIAGNOSIS — Z1231 Encounter for screening mammogram for malignant neoplasm of breast: Secondary | ICD-10-CM

## 2012-08-05 ENCOUNTER — Encounter: Payer: 59 | Admitting: Gynecology

## 2012-08-11 ENCOUNTER — Telehealth: Payer: Self-pay | Admitting: *Deleted

## 2012-08-11 ENCOUNTER — Ambulatory Visit (INDEPENDENT_AMBULATORY_CARE_PROVIDER_SITE_OTHER): Payer: 59 | Admitting: Gynecology

## 2012-08-11 ENCOUNTER — Encounter: Payer: Self-pay | Admitting: Gynecology

## 2012-08-11 VITALS — BP 122/76 | Ht 68.0 in | Wt 182.0 lb

## 2012-08-11 DIAGNOSIS — D0500 Lobular carcinoma in situ of unspecified breast: Secondary | ICD-10-CM

## 2012-08-11 DIAGNOSIS — Z01419 Encounter for gynecological examination (general) (routine) without abnormal findings: Secondary | ICD-10-CM

## 2012-08-11 NOTE — Telephone Encounter (Signed)
Order placed at [facility].

## 2012-08-11 NOTE — Patient Instructions (Signed)
Office will contact you to arrange MRI of the breast. Followup in one year for annual exam.

## 2012-08-11 NOTE — Telephone Encounter (Signed)
Message copied by Aura Camps on Wed Aug 11, 2012 10:33 AM ------      Message from: Dara Lords      Created: Wed Aug 11, 2012 10:17 AM       Schedule MRI of the breast reference lobular carcinoma in situ oncologist recommendation for every other year MRI. Patient due now. ------

## 2012-08-11 NOTE — Progress Notes (Signed)
Tina [name] [birth year] [id number]        57 y.o.  G2P2002 for annual exam.  Doing well without complaints.  Past medical history,surgical history, medications, allergies, family history and social history were all reviewed and documented in the EPIC chart. ROS:  Was performed and pertinent positives and negatives are included in the history.  Exam: Kim assistant Filed Vitals:   08/11/12 0932  BP: 122/76  Height: 5\' 8"  (1.727 m)  Weight: 182 lb (82.555 kg)   General appearance  Normal Skin grossly normal Head/Neck normal with no cervical or supraclavicular adenopathy thyroid normal Lungs  clear Cardiac RR, without RMG Abdominal  soft, nontender, without masses, organomegaly or hernia Breasts  examined lying and sitting without masses, retractions, discharge or axillary adenopathy. Pelvic  Ext/BUS/vagina  normal with mild atrophic changes  Cervix  normal   Uterus  axial to anteverted, normal size, shape and contour, midline and mobile nontender   Adnexa  Without masses or tenderness    Anus and perineum  normal   Rectovaginal  normal sphincter tone without palpated masses or tenderness.    Assessment/Plan:  57 y.o. G16P2002 female for annual exam.   1. Postmenopausal. Doing well without significant symptoms such as hot flashes, night sweats vaginal dryness or dyspareunia. We'll continue to follow. 2. 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 Had been followed by oncology but no longer sees them. Had been recommended to have annual mammography and every other year MRIs. Had mammography January 2014. Patient due for MRI now we'll help her arrange this. SBE monthly reviewed. 3. Osteopenia. DEXA 07/2011 with T score -1.1 FRAX 13%/0.4% plan repeat in several years. Increase calcium vitamin D reviewed along with weight-bearing exercise. 4. Pap smear 2012. No Pap smear done today. No history of abnormal Pap smears. Plan repeat next year at 3 year interval. 5. Colonoscopy 2 years  ago. Repeat at their recommended interval. 6. Health maintenance. The lab work done as it is all done through her primary physician's office who she sees on a regular basis. Followup one year, sooner as needed.    Dara Lords MD, 9:53 AM 08/11/2012

## 2012-08-12 NOTE — Telephone Encounter (Signed)
Appt. 08/25/12 @ 9:30 am

## 2012-08-12 NOTE — Telephone Encounter (Signed)
Spoke with [facility] and they will contact pt to schedule.

## 2012-08-16 ENCOUNTER — Telehealth: Payer: Self-pay

## 2012-08-16 NOTE — Telephone Encounter (Signed)
I called [facility] and notified them of Notification # [phone]  Valid x 45 cal days and exp 09/30/12 for patient's breast MRI.

## 2012-08-25 ENCOUNTER — Ambulatory Visit
Admission: RE | Admit: 2012-08-25 | Discharge: 2012-08-25 | Disposition: A | Discharge status: Home / Self Care | Payer: 59 | Source: Ambulatory Visit | Attending: Gynecology | Admitting: Gynecology

## 2012-08-25 DIAGNOSIS — D0500 Lobular carcinoma in situ of unspecified breast: Secondary | ICD-10-CM

## 2012-08-25 MED ORDER — GADOBENATE DIMEGLUMINE 529 MG/ML IV SOLN
18.0000 mL | Freq: Once | INTRAVENOUS | Status: AC | PRN
  Administered 2012-08-25: 18 mL via INTRAVENOUS

## 2012-09-30 ENCOUNTER — Encounter: Payer: Self-pay | Admitting: Gynecology

## 2012-12-12 ENCOUNTER — Ambulatory Visit (INDEPENDENT_AMBULATORY_CARE_PROVIDER_SITE_OTHER): Payer: 59 | Admitting: Physician Assistant

## 2012-12-12 VITALS — BP 128/72 | HR 64 | Temp 98.2°F | Resp 16 | Ht 68.0 in | Wt 199.0 lb

## 2012-12-12 DIAGNOSIS — L299 Pruritus, unspecified: Secondary | ICD-10-CM

## 2012-12-12 DIAGNOSIS — L255 Unspecified contact dermatitis due to plants, except food: Secondary | ICD-10-CM

## 2012-12-12 MED ORDER — PREDNISONE 20 MG PO TABS: ORAL_TABLET | ORAL | Status: DC

## 2012-12-12 MED ORDER — CLOBETASOL PROPIONATE 0.05 % EX OINT: TOPICAL_OINTMENT | Freq: Two times a day (BID) | CUTANEOUS | Status: DC

## 2012-12-12 NOTE — Progress Notes (Signed)
  Subjective:    Patient ID: Tina [name], female    DOB: 03/23/1956, 57 y.o.   MRN: [id number]  HPI This 57 y.o. female presents for evaluation of 4 days of itchy rash since working in the yard and coming into contact with Rhus species plants. Has used calamine lotion with minimal benefit.  She continues to develop new areas of irritation, now involving the RIGHT arm, LEFT neck, LEFT face and anterior abdominal wall just above the waist.  Past medical history, surgical history, family history, social history and problem list reviewed.   Review of Systems No respiratory symptoms.    Objective:   Physical Exam BP 128/72  Pulse 64  Temp(Src) 98.2 F (36.8 C)  Resp 16  Ht 5\' 8"  (1.727 m)  Wt 199 lb (90.266 kg)  BMI 30.26 kg/m2  SpO2 99%  LMP 05/05/2010 WDWNWF, A&O x 3  Patches and scattered erythematous papules and vesicles, many coalesced in plaques, consistent with contact dermatitis from Rhus species plants.       Assessment & Plan:  Dermatitis due to plants, including poison ivy, sumac, and oak - Plan: predniSONE (DELTASONE) 20 MG tablet, clobetasol ointment (TEMOVATE) 0.05 %  Itching  Anticipatory guidance.  Supportive care.  Fernande Bras, PA-C Certified Physician Assistant [facility]

## 2012-12-12 NOTE — Patient Instructions (Addendum)
Stay cool and dry as best you can. You may use an oral antihistamine in addition if needed.

## 2013-01-13 ENCOUNTER — Ambulatory Visit (INDEPENDENT_AMBULATORY_CARE_PROVIDER_SITE_OTHER): Payer: 59 | Admitting: Family Medicine

## 2013-01-13 VITALS — BP 124/84 | HR 71 | Temp 97.8°F | Resp 16 | Ht 68.0 in | Wt 197.8 lb

## 2013-01-13 DIAGNOSIS — J019 Acute sinusitis, unspecified: Secondary | ICD-10-CM

## 2013-01-13 MED ORDER — AMOXICILLIN-POT CLAVULANATE 875-125 MG PO TABS: 1.0000 | ORAL_TABLET | Freq: Two times a day (BID) | ORAL | Status: DC

## 2013-01-13 NOTE — Progress Notes (Signed)
[facility] [address], [place] [address] [phone]  Date:  01/13/2013   Name:  Tina [name]   DOB:  [birth year]   MRN:  [id number]  PCP:  Kari Baars, MD    Chief Complaint: Facial Pain, Sinus Problem and Otalgia   History of Present Illness:  Tina [name] is a 57 y.o. very pleasant female patient who presents with the following:  She noted pain in the right side of her face, and then left ear pain this morning.  She had a temp of 99 last night.  She has been up since 0430 this am. No cough, no ST.  No sinus congestion.   She is mostly concerned because she is leaving for a trip to [facility] this afternoon. No GI symptoms.   histoty of HTN and high cholesterol  She is a semi- retired Nurse, mental health PA  Patient Active Problem List   Diagnosis Date Noted  . HTN (hypertension) 02/01/2012  . Other and unspecified hyperlipidemia 02/01/2012    Past Medical History  Diagnosis Date  . Hypertension   . Breast discharge     dark bloody discharge   . Breast pain   . Abnormal breast tissue 2005    florid hyperplasia; precancerous tissue  . Osteopenia 07/2011    t score -1.1 FRAX 13%/0.4%  . Hyperlipidemia   . Cancer 2002    Breast cancer-Lobular carcinoma in situ    Past Surgical History  Procedure Laterality Date  . Cholecystectomy  2003  . Breast surgery  2004    excisional right breast surgery     History  Substance Use Topics  . Smoking status: Never Smoker   . Smokeless tobacco: Never Used  . Alcohol Use: Yes     Comment: rarely    Family History  Problem Relation Age of Onset  . Stroke Father 71  . Diabetes Father   . Hypertension Father   . Heart disease Father     bypass  . Cancer Father     parotid cancer  . Cancer Mother 68    uterine  . Osteoporosis Mother   . Diabetes Brother     type 2  . Cancer Maternal Uncle     colon  . Cancer Paternal Uncle     colon  . Diabetes Son 59    MODY  . Asthma Son     No  Known Allergies  Medication list has been reviewed and updated.  Current Outpatient Prescriptions on File Prior to Visit  Medication Sig Dispense Refill  . Calcium Carbonate-Vitamin D (CALCIUM + D PO) Take by mouth.      . Cholecalciferol (VITAMIN D PO) Take by mouth.      . clobetasol ointment (TEMOVATE) 0.05 % Apply topically 2 (two) times daily.  60 g  0  . losartan (COZAAR) 50 MG tablet Take 1 tablet (50 mg total) by mouth daily.  30 tablet  3  . LOVAZA 1 G capsule 4 mg daily.       . Multiple Vitamin (MULTIVITAMIN) tablet Take 1 tablet by mouth daily.      . predniSONE (DELTASONE) 20 MG tablet Take 3 PO QAM x3days, 2 PO QAM x3days, 1 PO QAM x3days  18 tablet  0   No current facility-administered medications on file prior to visit.    Review of Systems:  As per HPI- otherwise negative.   Physical Examination: Filed Vitals:   01/13/13 0809  BP: 124/84  Pulse: 71  Temp: 97.8 F (36.6 C)  Resp: 16   Filed Vitals:   01/13/13 0809  Height: 5\' 8"  (1.727 m)  Weight: 197 lb 12.8 oz (89.721 kg)   Body mass index is 30.08 kg/(m^2). Ideal Body Weight: Weight in (lb) to have BMI = 25: 164.1  GEN: WDWN, NAD, Non-toxic, A & O x 3, overweight HEENT: Atraumatic, Normocephalic. Neck supple. No masses, No LAD.  Bilateral TM wnl, oropharynx normal.  PEERL,EOMI.   Nasal congestion and inflammation Ears and Nose: No external deformity. CV: RRR, No M/G/R. No JVD. No thrill. No extra heart sounds. PULM: CTA B, no wheezes, crackles, rhonchi. No retractions. No resp. distress. No accessory muscle use. EXTR: No c/c/e NEURO Normal gait.  PSYCH: Normally interactive. Conversant. Not depressed or anxious appearing.  Calm demeanor.    Assessment and Plan: Sinusitis, acute - Plan: amoxicillin-clavulanate (AUGMENTIN) 875-125 MG per tablet  Leaving for a trip this afternoon and concerned about illness.  rx for augmentin to hold.  Let me know if not better.    Signed Abbe Amsterdam,  MD

## 2013-01-13 NOTE — Patient Instructions (Addendum)
Have a great trip!  If you are not better in the next couple of days go ahead and start the augmentin.  In the meantime continue to use OTC medications as needed for your discomfort.

## 2013-03-17 ENCOUNTER — Other Ambulatory Visit: Payer: Self-pay

## 2013-05-10 ENCOUNTER — Other Ambulatory Visit: Payer: Self-pay

## 2013-05-10 DIAGNOSIS — Z1231 Encounter for screening mammogram for malignant neoplasm of breast: Secondary | ICD-10-CM

## 2013-06-14 ENCOUNTER — Ambulatory Visit
Admission: RE | Admit: 2013-06-14 | Discharge: 2013-06-14 | Disposition: A | Discharge status: Home / Self Care | Payer: 59 | Source: Ambulatory Visit

## 2013-06-14 DIAGNOSIS — Z1231 Encounter for screening mammogram for malignant neoplasm of breast: Secondary | ICD-10-CM

## 2013-08-18 ENCOUNTER — Encounter: Payer: 59 | Admitting: Gynecology

## 2013-08-25 ENCOUNTER — Ambulatory Visit (INDEPENDENT_AMBULATORY_CARE_PROVIDER_SITE_OTHER): Payer: 59 | Admitting: Gynecology

## 2013-08-25 ENCOUNTER — Other Ambulatory Visit (HOSPITAL_COMMUNITY)
Admission: RE | Admit: 2013-08-25 | Discharge: 2013-08-25 | Disposition: A | Discharge status: Home / Self Care | Payer: 59 | Source: Ambulatory Visit | Attending: Gynecology | Admitting: Gynecology

## 2013-08-25 ENCOUNTER — Encounter: Payer: Self-pay | Admitting: Gynecology

## 2013-08-25 VITALS — BP 120/74 | Ht 68.0 in | Wt 192.0 lb

## 2013-08-25 DIAGNOSIS — Z01419 Encounter for gynecological examination (general) (routine) without abnormal findings: Secondary | ICD-10-CM | POA: Insufficient documentation

## 2013-08-25 DIAGNOSIS — C50919 Malignant neoplasm of unspecified site of unspecified female breast: Secondary | ICD-10-CM

## 2013-08-25 DIAGNOSIS — N952 Postmenopausal atrophic vaginitis: Secondary | ICD-10-CM

## 2013-08-25 DIAGNOSIS — Z1151 Encounter for screening for human papillomavirus (HPV): Secondary | ICD-10-CM | POA: Insufficient documentation

## 2013-08-25 LAB — URINALYSIS W MICROSCOPIC + REFLEX CULTURE
Bacteria, UA: NONE SEEN
Bilirubin Urine: NEGATIVE
Casts: NONE SEEN
Glucose, UA: NEGATIVE mg/dL
Hgb urine dipstick: NEGATIVE
Ketones, ur: NEGATIVE mg/dL
Leukocytes, UA: NEGATIVE
Nitrite: NEGATIVE
Protein, ur: NEGATIVE mg/dL
RBC / HPF: NONE SEEN RBC/hpf (ref <3)
Specific Gravity, Urine: 1.02 (ref 1.005–1.030)
Urobilinogen, UA: 0.2 mg/dL (ref 0.0–1.0)
WBC, UA: NONE SEEN WBC/hpf (ref <3)
pH: 6 (ref 5.0–8.0)

## 2013-08-25 MED ORDER — SCOPOLAMINE 1 MG/3DAYS TD PT72: 1.0000 | MEDICATED_PATCH | TRANSDERMAL | Status: DC

## 2013-08-25 NOTE — Progress Notes (Signed)
Tina [name] Dec 30, 1955 [id number]        58 y.o.  G2P2002 for annual exam.  Doing well. Several issues as below.  Past medical history,surgical history, problem list, medications, allergies, family history and social history were all reviewed and documented as reviewed in the EPIC chart.  ROS:  12 system ROS performed with pertinent positives and negatives included in the history, assessment and plan.  Included Systems: General, HEENT, Neck, Cardiovascular, Pulmonary, Gastrointestinal, Genitourinary, Musculoskeletal, Dermatologic, Endocrine, Hematological, Neurologic, Psychiatric Additional significant findings : Reflux related to diet   Exam: Kim assistant Filed Vitals:   08/25/13 0800  BP: 120/74  Height: 5\' 8"  (1.727 m)  Weight: 192 lb (87.091 kg)   General appearance:  Normal affect, orientation and appearance. Skin: Grossly normal HEENT: Normal without gross oral lesions, cervical or supraclavicular adenopathy. Thyroid normal.  Ears/nose appear normal Lungs:  Clear without wheezing, rales or rhonchi Cardiac: RR, without RMG Abdominal:  Soft, nontender, without masses, guarding, rebound, organomegaly or hernia Breasts:  Examined lying and sitting without masses, retractions, discharge or axillary adenopathy. Well-healed right lumpectomy scar. Pelvic:  Ext/BUS/vagina with atrophic changes  Cervix atrophic. HPV done  Uterus anteverted, normal size, shape and contour, midline and mobile nontender   Adnexa  Without masses or tenderness    Anus and perineum  Normal   Rectovaginal  Normal sphincter tone without palpated masses or tenderness.    Assessment/Plan:  58 y.o. G60P2002 female for annual exam.   1. Postmenopausal/atrophic genital changes. No significant symptoms of hot flushes, night sweats, vaginal dryness, dyspareunia. No vaginal bleeding. Will continue to monitor. Report any vaginal bleeding. 2. 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 Exam NED.  Mammography 06/2013. MRI 2014 with planned repeat every other year. Continue with annual mammography. SBE monthly reviewed. 3. Osteopenia. DEXA 07/2011 T score -1.1. FRAX 13%/0.4%. Options to repeat this year at 2 year interval for trending or wait one or 2 years discussed. Patient wants to wait until next year. Increase calcium vitamin D and weightbearing exercise. 4. Pap smear 2012. Pap/HPV today. No history of abnormal Pap smears previously. Repeated 3-5 year interval assuming normal. 5. Colonoscopy 2011. Planned repeat at 5 year interval. 6. Health maintenance. No blood work done as this is done through her primary physician's office. Followup one year, sooner as needed.   Note: This document was prepared with digital dictation and possible smart phrase technology. Any transcriptional errors that result from this process are unintentional.   Anastasio Auerbach MD, 8:28 AM 08/25/2013

## 2013-08-25 NOTE — Addendum Note (Signed)
Addended by: Nelva Nay on: 08/25/2013 08:40 AM   Modules accepted: Orders

## 2013-08-25 NOTE — Patient Instructions (Signed)
Followup in one year for annual exam, sooner as needed.  Scopolamine skin patches What is this medicine? SCOPOLAMINE (skoe POL a meen) is used to prevent nausea and vomiting caused by motion sickness, anesthesia and surgery. This medicine may be used for other purposes; ask your health care provider or pharmacist if you have questions. COMMON BRAND NAME(S): Transderm Scop What should I tell my health care provider before I take this medicine? They need to know if you have any of these conditions: -glaucoma -kidney or liver disease -an unusual or allergic reaction (especially skin allergy) to scopolamine, atropine, other medicines, foods, dyes, or preservatives -pregnant or trying to get pregnant -breast-feeding How should I use this medicine? This medicine is for external use only. Follow the directions on the prescription label. One patch contains enough medicine to prevent motion sickness for up to 3 days. Apply the patch at least 4 hours before you need it and only wear one disc at a time. Choose an area behind the ear, that is clean, dry, hairless and free from any cuts or irritation. Wipe the area with a clean dry tissue. Peel off the plastic backing of the skin patch, trying not to touch the adhesive side with your hands. Do not cut the patches. Firmly apply to the area you have chosen, with the metallic side of the patch to the skin and the tan-colored side showing. Once firmly in place, wash your hands well with soap and water. Remove the disc after 3 days, or sooner if you no longer need it. After removing the patch, wash your hands and the area behind your ear thoroughly with soap and water. The patch will still contain some medicine after use. To avoid accidental contact or ingestion by children or pets, fold the used patch in half with the sticky side together and throw away in the trash out of the reach of children and pets. If you need to use a second patch after you remove the first,  place it behind the other ear.  Talk to your pediatrician regarding the use of this medicine in children. Special care may be needed. Overdosage: If you think you have taken too much of this medicine contact a poison control center or emergency room at once. NOTE: This medicine is only for you. Do not share this medicine with others. What if I miss a dose? Make sure you apply the patch at least 4 hours before you need it. You can apply it the night before traveling. What may interact with this medicine? -benztropine -bethanechol -medicines for anxiety or sleeping problems like diazepam or temazepam -medicines for hay fever and other allergies -medicines for mental depression -muscle relaxants This list may not describe all possible interactions. Give your health care provider a list of all the medicines, herbs, non-prescription drugs, or dietary supplements you use. Also tell them if you smoke, drink alcohol, or use illegal drugs. Some items may interact with your medicine. What should I watch for while using this medicine? Keep the patch dry, if possible, to prevent it from falling off. Limited contact with water, however, as in bathing or swimming, will not affect the system. If the patch falls off, throw it away and put a new one behind the other ear. You may get drowsy or dizzy. Do not drive, use machinery, or do anything that needs mental alertness until you know how this medicine affects you. Do not stand or sit up quickly, especially if you are an older patient.  This reduces the risk of dizzy or fainting spells. Alcohol may interfere with the effect of this medicine. Avoid alcoholic drinks. Your mouth may get dry. Chewing sugarless gum or sucking hard candy, and drinking plenty of water may help. Contact your doctor if the problem does not go away or is severe. This medicine may cause dry eyes and blurred vision. If you wear contact lenses you may feel some discomfort. Lubricating drops may  help. See your eye doctor if the problem does not go away or is severe. If you are going to have a magnetic resonance imaging (MRI) procedure, tell your MRI technician if you have this patch on your body. It must be removed before a MRI. What side effects may I notice from receiving this medicine? Side effects that you should report to your doctor or health care professional as soon as possible: -agitation, nervousness, confusion -blurred vision and other eye problems -dizziness, drowsiness -eye pain or redness in the whites of the eye -hallucinations -pain or difficulty passing urine -skin rash, itching -vomiting Side effects that usually do not require medical attention (report to your doctor or health care professional if they continue or are bothersome): -headache -nausea This list may not describe all possible side effects. Call your doctor for medical advice about side effects. You may report side effects to FDA at 1-800-FDA-1088. Where should I keep my medicine? Keep out of the reach of children. Store at room temperature between 20 and 25 degrees C (68 and 77 degrees F). Throw away any unused medicine after the expiration date. When you remove a patch, fold it and throw it in the trash as described above. NOTE: This sheet is a summary. It may not cover all possible information. If you have questions about this medicine, talk to your doctor, pharmacist, or health care provider.  2014, Elsevier/Gold Standard. (2011-09-25 13:31:48)

## 2014-03-13 ENCOUNTER — Encounter: Payer: Self-pay | Admitting: Gynecology

## 2014-05-17 ENCOUNTER — Encounter: Payer: Self-pay | Admitting: Gynecology

## 2014-05-17 ENCOUNTER — Other Ambulatory Visit: Payer: Self-pay

## 2014-05-17 DIAGNOSIS — Z1231 Encounter for screening mammogram for malignant neoplasm of breast: Secondary | ICD-10-CM

## 2014-05-18 ENCOUNTER — Telehealth: Payer: Self-pay | Admitting: *Deleted

## 2014-05-18 DIAGNOSIS — D0500 Lobular carcinoma in situ of unspecified breast: Secondary | ICD-10-CM

## 2014-05-18 NOTE — Telephone Encounter (Signed)
Order placed for M[facility] # given to pt to contact them to schedule.

## 2014-05-18 NOTE — Telephone Encounter (Signed)
-----   Message from Sandi Mariscal, [place] sent at 05/18/2014  9:18 AM EST ----- Regarding: MRI [facility], Per Dr. Loetta Rough "Help patient arrange for her breast MRI. Re: History of lobular carcinoma in situ."  Thanks!

## 2014-05-18 NOTE — Telephone Encounter (Signed)
appointment on 06/21/13 @ 1:00pm

## 2014-05-18 NOTE — Telephone Encounter (Signed)
Help patient arrange for her breast MRI.   Re: History of lobular carcinoma in situ

## 2014-06-15 ENCOUNTER — Ambulatory Visit
Admission: RE | Admit: 2014-06-15 | Discharge: 2014-06-15 | Disposition: A | Discharge status: Home / Self Care | Payer: 59 | Source: Ambulatory Visit

## 2014-06-15 DIAGNOSIS — Z1231 Encounter for screening mammogram for malignant neoplasm of breast: Secondary | ICD-10-CM

## 2014-06-19 ENCOUNTER — Telehealth: Payer: Self-pay

## 2014-06-19 NOTE — Telephone Encounter (Signed)
I spoke with Janett Billow (?) at [facility] and received prior auth for patient's Breast MRI scheduled 06/21/14.  Josem Kaufmann [phone] and it expires on 08/03/14.

## 2014-06-21 ENCOUNTER — Ambulatory Visit: Payer: Self-pay

## 2014-06-21 ENCOUNTER — Ambulatory Visit
Admission: RE | Admit: 2014-06-21 | Discharge: 2014-06-21 | Disposition: A | Discharge status: Home / Self Care | Payer: 59 | Source: Ambulatory Visit | Attending: Gynecology | Admitting: Gynecology

## 2014-06-21 DIAGNOSIS — D0500 Lobular carcinoma in situ of unspecified breast: Secondary | ICD-10-CM

## 2014-06-21 MED ORDER — GADOBENATE DIMEGLUMINE 529 MG/ML IV SOLN
18.0000 mL | Freq: Once | INTRAVENOUS | Status: AC | PRN
  Administered 2014-06-21: 18 mL via INTRAVENOUS

## 2014-09-14 ENCOUNTER — Encounter: Payer: Self-pay | Admitting: Gynecology

## 2014-09-14 ENCOUNTER — Ambulatory Visit (INDEPENDENT_AMBULATORY_CARE_PROVIDER_SITE_OTHER): Payer: 59 | Admitting: Gynecology

## 2014-09-14 VITALS — BP 112/70 | Ht 70.0 in | Wt 191.0 lb

## 2014-09-14 DIAGNOSIS — M8580 Other specified disorders of bone density and structure, unspecified site: Secondary | ICD-10-CM

## 2014-09-14 DIAGNOSIS — N952 Postmenopausal atrophic vaginitis: Secondary | ICD-10-CM | POA: Diagnosis not present

## 2014-09-14 DIAGNOSIS — Z01419 Encounter for gynecological examination (general) (routine) without abnormal findings: Secondary | ICD-10-CM

## 2014-09-14 NOTE — Patient Instructions (Signed)
Follow up for bone density as scheduled.  You may obtain a copy of any labs that were done today by logging onto MyChart as outlined in the instructions provided with your AVS (after visit summary). The office will not call with normal lab results but certainly if there are any significant abnormalities then we will contact you.   Health Maintenance, Female A healthy lifestyle and preventative care can promote health and wellness.  Maintain regular health, dental, and eye exams.  Eat a healthy diet. Foods like vegetables, fruits, whole grains, low-fat dairy products, and lean protein foods contain the nutrients you need without too many calories. Decrease your intake of foods high in solid fats, added sugars, and salt. Get information about a proper diet from your caregiver, if necessary.  Regular physical exercise is one of the most important things you can do for your health. Most adults should get at least 150 minutes of moderate-intensity exercise (any activity that increases your heart rate and causes you to sweat) each week. In addition, most adults need muscle-strengthening exercises on 2 or more days a week.   Maintain a healthy weight. The body mass index (BMI) is a screening tool to identify possible weight problems. It provides an estimate of body fat based on height and weight. Your caregiver can help determine your BMI, and can help you achieve or maintain a healthy weight. For adults 20 years and older:  A BMI below 18.5 is considered underweight.  A BMI of 18.5 to 24.9 is normal.  A BMI of 25 to 29.9 is considered overweight.  A BMI of 30 and above is considered obese.  Maintain normal blood lipids and cholesterol by exercising and minimizing your intake of saturated fat. Eat a balanced diet with plenty of fruits and vegetables. Blood tests for lipids and cholesterol should begin at age 20 and be repeated every 5 years. If your lipid or cholesterol levels are high, you are over  50, or you are a high risk for heart disease, you may need your cholesterol levels checked more frequently.Ongoing high lipid and cholesterol levels should be treated with medicines if diet and exercise are not effective.  If you smoke, find out from your caregiver how to quit. If you do not use tobacco, do not start.  Lung cancer screening is recommended for adults aged 55 80 years who are at high risk for developing lung cancer because of a history of smoking. Yearly low-dose computed tomography (CT) is recommended for people who have at least a 30-pack-year history of smoking and are a current smoker or have quit within the past 15 years. A pack year of smoking is smoking an average of 1 pack of cigarettes a day for 1 year (for example: 1 pack a day for 30 years or 2 packs a day for 15 years). Yearly screening should continue until the smoker has stopped smoking for at least 15 years. Yearly screening should also be stopped for people who develop a health problem that would prevent them from having lung cancer treatment.  If you are pregnant, do not drink alcohol. If you are breastfeeding, be very cautious about drinking alcohol. If you are not pregnant and choose to drink alcohol, do not exceed 1 drink per day. One drink is considered to be 12 ounces (355 mL) of beer, 5 ounces (148 mL) of wine, or 1.5 ounces (44 mL) of liquor.  Avoid use of street drugs. Do not share needles with anyone. Ask for help   if you need support or instructions about stopping the use of drugs.  High blood pressure causes heart disease and increases the risk of stroke. Blood pressure should be checked at least every 1 to 2 years. Ongoing high blood pressure should be treated with medicines, if weight loss and exercise are not effective.  If you are 55 to 61 years old, ask your caregiver if you should take aspirin to prevent strokes.  Diabetes screening involves taking a blood sample to check your fasting blood sugar level.  This should be done once every 3 years, after age 45, if you are within normal weight and without risk factors for diabetes. Testing should be considered at a younger age or be carried out more frequently if you are overweight and have at least 1 risk factor for diabetes.  Breast cancer screening is essential preventative care for women. You should practice "breast self-awareness." This means understanding the normal appearance and feel of your breasts and may include breast self-examination. Any changes detected, no matter how small, should be reported to a caregiver. Women in their 20s and 30s should have a clinical breast exam (CBE) by a caregiver as part of a regular health exam every 1 to 3 years. After age 40, women should have a CBE every year. Starting at age 40, women should consider having a mammogram (breast X-ray) every year. Women who have a family history of breast cancer should talk to their caregiver about genetic screening. Women at a high risk of breast cancer should talk to their caregiver about having an MRI and a mammogram every year.  Breast cancer gene (BRCA)-related cancer risk assessment is recommended for women who have family members with BRCA-related cancers. BRCA-related cancers include breast, ovarian, tubal, and peritoneal cancers. Having family members with these cancers may be associated with an increased risk for harmful changes (mutations) in the breast cancer genes BRCA1 and BRCA2. Results of the assessment will determine the need for genetic counseling and BRCA1 and BRCA2 testing.  The Pap test is a screening test for cervical cancer. Women should have a Pap test starting at age 21. Between ages 21 and 29, Pap tests should be repeated every 2 years. Beginning at age 30, you should have a Pap test every 3 years as long as the past 3 Pap tests have been normal. If you had a hysterectomy for a problem that was not cancer or a condition that could lead to cancer, then you no  longer need Pap tests. If you are between ages 65 and 70, and you have had normal Pap tests going back 10 years, you no longer need Pap tests. If you have had past treatment for cervical cancer or a condition that could lead to cancer, you need Pap tests and screening for cancer for at least 20 years after your treatment. If Pap tests have been discontinued, risk factors (such as a new sexual partner) need to be reassessed to determine if screening should be resumed. Some women have medical problems that increase the chance of getting cervical cancer. In these cases, your caregiver may recommend more frequent screening and Pap tests.  The human papillomavirus (HPV) test is an additional test that may be used for cervical cancer screening. The HPV test looks for the virus that can cause the cell changes on the cervix. The cells collected during the Pap test can be tested for HPV. The HPV test could be used to screen women aged 30 years and older, and   should be used in women of any age who have unclear Pap test results. After the age of 30, women should have HPV testing at the same frequency as a Pap test.  Colorectal cancer can be detected and often prevented. Most routine colorectal cancer screening begins at the age of 50 and continues through age 75. However, your caregiver may recommend screening at an earlier age if you have risk factors for colon cancer. On a yearly basis, your caregiver may provide home test kits to check for hidden blood in the stool. Use of a small camera at the end of a tube, to directly examine the colon (sigmoidoscopy or colonoscopy), can detect the earliest forms of colorectal cancer. Talk to your caregiver about this at age 50, when routine screening begins. Direct examination of the colon should be repeated every 5 to 10 years through age 75, unless early forms of pre-cancerous polyps or small growths are found.  Hepatitis C blood testing is recommended for all people born from  1945 through 1965 and any individual with known risks for hepatitis C.  Practice safe sex. Use condoms and avoid high-risk sexual practices to reduce the spread of sexually transmitted infections (STIs). Sexually active women aged 25 and younger should be checked for Chlamydia, which is a common sexually transmitted infection. Older women with new or multiple partners should also be tested for Chlamydia. Testing for other STIs is recommended if you are sexually active and at increased risk.  Osteoporosis is a disease in which the bones lose minerals and strength with aging. This can result in serious bone fractures. The risk of osteoporosis can be identified using a bone density scan. Women ages 65 and over and women at risk for fractures or osteoporosis should discuss screening with their caregivers. Ask your caregiver whether you should be taking a calcium supplement or vitamin D to reduce the rate of osteoporosis.  Menopause can be associated with physical symptoms and risks. Hormone replacement therapy is available to decrease symptoms and risks. You should talk to your caregiver about whether hormone replacement therapy is right for you.  Use sunscreen. Apply sunscreen liberally and repeatedly throughout the day. You should seek shade when your shadow is shorter than you. Protect yourself by wearing long sleeves, pants, a wide-brimmed hat, and sunglasses year round, whenever you are outdoors.  Notify your caregiver of new moles or changes in moles, especially if there is a change in shape or color. Also notify your caregiver if a mole is larger than the size of a pencil eraser.  Stay current with your immunizations. Document Released: 11/11/2010 Document Revised: 08/23/2012 Document Reviewed: 11/11/2010 Exit[employer] Patient Information 2014 [employer].   

## 2014-09-14 NOTE — Progress Notes (Signed)
Tina [name] June 30, 1955 [id number]        58 y.o.  G2P2002 for annual exam.  Doing well without complaints.  Past medical history,surgical history, problem list, medications, allergies, family history and social history were all reviewed and documented as reviewed in the EPIC chart.  ROS:  Performed with pertinent positives and negatives included in the history, assessment and plan.   Additional significant findings :  none   Exam: Kim Counsellor Vitals:   09/14/14 1154  BP: 112/70  Height: 5\' 10"  (1.778 m)  Weight: 191 lb (86.637 kg)   General appearance:  Normal affect, orientation and appearance. Skin: Grossly normal HEENT: Without gross lesions.  No cervical or supraclavicular adenopathy. Thyroid normal.  Lungs:  Clear without wheezing, rales or rhonchi Cardiac: RR, without RMG Abdominal:  Soft, nontender, without masses, guarding, rebound, organomegaly or hernia Breasts:  Examined lying and sitting without masses, retractions, discharge or axillary adenopathy.  Well-healed right lumpectomy scar Pelvic:  Ext/BUS/vagina with atrophic changes  Cervix with atrophic changes. Flush with the upper vagina.  Uterus anteverted, normal size, shape and contour, midline and mobile nontender   Adnexa  Without masses or tenderness    Anus and perineum  Normal   Rectovaginal  Normal sphincter tone without palpated masses or tenderness.    Assessment/Plan:  59 y.o. [id number] female for annual exam.   1. Postmenopausal/atrophic genital changes. Patient without significant hot flashes, night sweats, vaginal dryness. No vaginal bleeding. Continue to monitor. Report any vaginal bleeding. 2. 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 Exam NED.  Mammography 06/2014. Continue with annual mammography.  Is doing MRIs every other year and is due for this also this year. SBE monthly reviewed. 3. Osteopenia. DEXA 2013 T score -1.1. FRAX 13%/0.4%. Repeat DEXA now at 3 year interval  when she will schedule. Increased calcium vitamin D. 4. Pap smear/HPV negative 2015. No Pap smear done today. No history of significant abnormal Pap smears previously. Plan repeat at 3-5 year interval. 5. Colonoscopy 2011. Repeat at their recommendation. 6. Health maintenance. No routine blood work done as this is done at her primary physician's office. Follow up for bone density otherwise annual exam in one year.     Anastasio Auerbach MD, 12:24 PM 09/14/2014

## 2014-09-15 LAB — URINALYSIS W MICROSCOPIC + REFLEX CULTURE
Bacteria, UA: NONE SEEN
Bilirubin Urine: NEGATIVE
Casts: NONE SEEN
Glucose, UA: NEGATIVE mg/dL
Hgb urine dipstick: NEGATIVE
Ketones, ur: NEGATIVE mg/dL
Leukocytes, UA: NEGATIVE
Nitrite: NEGATIVE
Protein, ur: NEGATIVE mg/dL
Specific Gravity, Urine: 1.021 (ref 1.005–1.030)
Squamous Epithelial / LPF: NONE SEEN
Urobilinogen, UA: 0.2 mg/dL (ref 0.0–1.0)
pH: 5.5 (ref 5.0–8.0)

## 2014-10-17 ENCOUNTER — Other Ambulatory Visit: Payer: Self-pay | Admitting: Gynecology

## 2014-10-17 ENCOUNTER — Ambulatory Visit (INDEPENDENT_AMBULATORY_CARE_PROVIDER_SITE_OTHER): Payer: 59

## 2014-10-17 DIAGNOSIS — M8580 Other specified disorders of bone density and structure, unspecified site: Secondary | ICD-10-CM

## 2014-10-17 DIAGNOSIS — Z13820 Encounter for screening for osteoporosis: Secondary | ICD-10-CM

## 2015-05-24 ENCOUNTER — Other Ambulatory Visit: Payer: Self-pay

## 2015-05-24 DIAGNOSIS — Z1231 Encounter for screening mammogram for malignant neoplasm of breast: Secondary | ICD-10-CM

## 2015-06-20 ENCOUNTER — Ambulatory Visit
Admission: RE | Admit: 2015-06-20 | Discharge: 2015-06-20 | Disposition: A | Discharge status: Home / Self Care | Payer: 59 | Source: Ambulatory Visit

## 2015-06-20 DIAGNOSIS — Z1231 Encounter for screening mammogram for malignant neoplasm of breast: Secondary | ICD-10-CM

## 2015-12-19 ENCOUNTER — Ambulatory Visit (INDEPENDENT_AMBULATORY_CARE_PROVIDER_SITE_OTHER): Payer: 59 | Admitting: Gynecology

## 2015-12-19 ENCOUNTER — Encounter: Payer: Self-pay | Admitting: Gynecology

## 2015-12-19 VITALS — BP 124/80 | Ht 67.5 in | Wt 193.0 lb

## 2015-12-19 DIAGNOSIS — N898 Other specified noninflammatory disorders of vagina: Secondary | ICD-10-CM | POA: Diagnosis not present

## 2015-12-19 DIAGNOSIS — Z01419 Encounter for gynecological examination (general) (routine) without abnormal findings: Secondary | ICD-10-CM

## 2015-12-19 DIAGNOSIS — N952 Postmenopausal atrophic vaginitis: Secondary | ICD-10-CM | POA: Diagnosis not present

## 2015-12-19 LAB — WET PREP FOR TRICH, YEAST, CLUE
Clue Cells Wet Prep HPF POC: NONE SEEN
Trich, Wet Prep: NONE SEEN

## 2015-12-19 MED ORDER — FLUCONAZOLE 150 MG PO TABS: 150.0000 mg | ORAL_TABLET | Freq: Once | ORAL | 0 refills | Status: AC

## 2015-12-19 NOTE — Progress Notes (Signed)
    PALESTINE GOVAN 05-Jan-1956 [id number]        60 y.o.  G2P2002  for annual exam.  Is having issues with vaginal dryness.  Past medical history,surgical history, problem list, medications, allergies, family history and social history were all reviewed and documented as reviewed in the EPIC chart.  ROS:  Performed with pertinent positives and negatives included in the history, assessment and plan.   Additional significant findings :  None   Exam: [name] assistant Vitals:   12/19/15 1110  BP: 124/80  Weight: 193 lb (87.5 kg)  Height: 5' 7.5" (1.715 m)   Body mass index is 29.78 kg/m.  General appearance:  Normal affect, orientation and appearance. Skin: Grossly normal HEENT: Without gross lesions.  No cervical or supraclavicular adenopathy. Thyroid normal.  Lungs:  Clear without wheezing, rales or rhonchi Cardiac: RR, without RMG Abdominal:  Soft, nontender, without masses, guarding, rebound, organomegaly or hernia Breasts:  Examined lying and sitting without masses, retractions, discharge or axillary adenopathy.  Well-healed right lumpectomy scar Pelvic:  Ext/BUS/Vagina with atrophic changes.  Cervix normal  Uterus anteverted, normal size, shape and contour, midline and mobile nontender   Adnexa without masses or tenderness    Anus and perineum normal   Rectovaginal normal sphincter tone without palpated masses or tenderness.    Assessment/Plan:  60 y.o. G66P2002 female for annual exam.   1. Postmenopausal/atrophic genital changes. Patient is having issues with vaginal dryness and some irritation. Also notes some discomfort with intercourse although she uses lubricants for this. Wet prep today does show yeast. We'll treat with Diflucan 150 mg 1 dose. 2 tabs prescribed to have available if needed. Discussed vaginal dryness with her and options to include OTC products such as Replens. I also reviewed if continues or worsens vaginal estrogen. Risks of absorption reviewed.  She does have a history of lobular carcinoma in situ the right breast. Will follow up if continues to be an issue. 2. History of lobular carcinoma right breast. Exam NED. Mammography 06/2015. Continue with annual mammography when due.  Is doing every other year MRIs with last study 2016. SBE monthly reviewed. 3. Osteopenia. DEXA 2013 T score -1.1. Follow up DEXA 2016 normal. Plan repeat at age 54. 45. Pap smear/HPV 2015. No Pap smear done today. No history of significant abnormal Pap smears. Plan repeat Pap smear at 5 year interval per current screening guidelines. 5. Colonoscopy 2016. Repeat at their recommended interval. 6. Health maintenance. No routine lab work done as this is done elsewhere. Follow up 1 year, sooner as needed.   Anastasio Auerbach MD, 11:42 AM 12/19/2015

## 2015-12-19 NOTE — Addendum Note (Signed)
Addended by: Nelva Nay on: 12/19/2015 02:12 PM   Modules accepted: Orders

## 2015-12-19 NOTE — Patient Instructions (Signed)

## 2016-04-10 ENCOUNTER — Ambulatory Visit (INDEPENDENT_AMBULATORY_CARE_PROVIDER_SITE_OTHER): Payer: 59 | Admitting: Physician Assistant

## 2016-04-10 VITALS — BP 112/68 | HR 85 | Temp 98.2°F | Resp 18 | Ht 69.0 in | Wt 191.2 lb

## 2016-04-10 DIAGNOSIS — H6592 Unspecified nonsuppurative otitis media, left ear: Secondary | ICD-10-CM

## 2016-04-10 DIAGNOSIS — J029 Acute pharyngitis, unspecified: Secondary | ICD-10-CM | POA: Diagnosis not present

## 2016-04-10 MED ORDER — AMOXICILLIN 500 MG PO CAPS: 1000.0000 mg | ORAL_CAPSULE | Freq: Three times a day (TID) | ORAL | 0 refills | Status: AC

## 2016-04-10 NOTE — Patient Instructions (Addendum)
     IF you received an x-ray today, you will receive an invoice from [facility]. Please contact [facility] at [phone] with questions or concerns regarding your invoice.   IF you received labwork today, you will receive an invoice from [employer]. Please contact [facility] at [phone] with questions or concerns regarding your invoice.   Our billing staff will not be able to assist you with questions regarding bills from these companies.  You will be contacted with the lab results as soon as they are available. The fastest way to get your results is to activate your My Chart account. Instructions are located on the last page of this paperwork. If you have not heard from [place] regarding the results in 2 weeks, please contact this office.     Otitis Media, Adult Otitis media is redness, soreness, and puffiness (swelling) in the space just behind your eardrum (middle ear). It may be caused by allergies or infection. It often happens along with a cold. Follow these instructions at home:  Take your medicine as told. Finish it even if you start to feel better.  Only take over-the-counter or prescription medicines for pain, discomfort, or fever as told by your doctor.  Follow up with your doctor as told. Contact a doctor if:  You have otitis media only in one ear, or bleeding from your nose, or both.  You notice a lump on your neck.  You are not getting better in 3-5 days.  You feel worse instead of better. Get help right away if:  You have pain that is not helped with medicine.  You have puffiness, redness, or pain around your ear.  You get a stiff neck.  You cannot move part of your face (paralysis).  You notice that the bone behind your ear hurts when you touch it. This information is not intended to replace advice given to you by your health care provider. Make sure you discuss any questions you have with your health care  provider. Document Released: 10/15/2007 Document Revised: 10/04/2015 Document Reviewed: 11/23/2012 Elsevier Interactive Patient Education  2017 [employer].

## 2016-04-10 NOTE — Progress Notes (Signed)
   Tina [name]  MRN: [id number] DOB: [birth year]  PCP: Marton Redwood, MD  Subjective:  Pt is a 60 year old female, history of HTN and HLD, who presents to clinic for illness. She has been feeling sick for one month - sinus pressure, sore throat, cough, congestion. Three days ago she developed pain in her left ear and left sinus. The left side of her throat is painful and it hurts her to swallow.  She feels like she is getting worse, not better.  She's tried ibuprofen, Sudafed and using nasal rinses.  Denies fever, chills, chest pain, palpitations, nausea, vomiting, diarrhea.   Review of Systems  Constitutional: Negative for chills, diaphoresis, fatigue and fever.  HENT: Positive for ear pain, sinus pain, sinus pressure and sore throat. Negative for congestion, postnasal drip, rhinorrhea and sneezing.   Respiratory: Negative for cough, chest tightness, shortness of breath and wheezing.   Cardiovascular: Negative for chest pain and palpitations.  Gastrointestinal: Negative for abdominal pain, diarrhea, nausea and vomiting.  Neurological: Negative for weakness, light-headedness and headaches.    Patient Active Problem List   Diagnosis Date Noted  . HTN (hypertension) 02/01/2012  . Other and unspecified hyperlipidemia 02/01/2012    Current Outpatient Prescriptions on File Prior to Visit  Medication Sig Dispense Refill  . Calcium Carbonate-Vitamin D (CALCIUM + D PO) Take by mouth.    . Cholecalciferol (VITAMIN D PO) Take by mouth.    . losartan (COZAAR) 50 MG tablet Take 1 tablet (50 mg total) by mouth daily. 30 tablet 3  . LOVAZA 1 G capsule 4 mg daily.     . Multiple Vitamin (MULTIVITAMIN) tablet Take 1 tablet by mouth daily.    Marland Kitchen omeprazole (PRILOSEC) 40 MG capsule Take 40 mg by mouth daily.     No current facility-administered medications on file prior to visit.     No Known Allergies   Objective:  BP 112/68 (BP Location: Right Arm, Patient Position: Sitting, Cuff Size:  Small)   Pulse 85   Temp 98.2 F (36.8 C) (Oral)   Resp 18   Ht 5\' 9"  (1.753 m)   Wt 191 lb 3.2 oz (86.7 kg)   LMP 05/05/2010   SpO2 96%   BMI 28.24 kg/m   Physical Exam  Constitutional: She is oriented to person, place, and time and well-developed, well-nourished, and in no distress. No distress.  HENT:  Right Ear: Tympanic membrane normal.  Left Ear: Tympanic membrane is erythematous and bulging.  Nose: Left sinus exhibits maxillary sinus tenderness.  Mouth/Throat: Mucous membranes are normal. Posterior oropharyngeal edema present. No oropharyngeal exudate or posterior oropharyngeal erythema.  Cardiovascular: Normal rate, regular rhythm and normal heart sounds.   Lymphadenopathy:    She has no cervical adenopathy.  Neurological: She is alert and oriented to person, place, and time. GCS score is 15.  Skin: Skin is warm and dry.  Psychiatric: Mood, memory, affect and judgment normal.  Vitals reviewed.   Assessment and Plan :  1. Sore throat 2. Left non-suppurative otitis media - amoxicillin (AMOXIL) 500 MG capsule; Take 2 capsules (1,000 mg total) by mouth 3 (three) times daily.  Dispense: 42 capsule; Refill: 0 - Supportive care encouraged: Drink plenty of fluids and rest. RTC in 5-7 days if no improvement .She understands and agrees with plan.   Mercer Pod, PA-C  [facility] 04/10/2016 2:03 PM

## 2016-04-15 ENCOUNTER — Encounter (HOSPITAL_COMMUNITY): Payer: Self-pay | Admitting: Emergency Medicine

## 2016-04-15 ENCOUNTER — Emergency Department (HOSPITAL_COMMUNITY)
Admission: EM | Admit: 2016-04-15 | Discharge: 2016-04-15 | Disposition: A | Discharge status: Home / Self Care | Payer: 59 | Attending: Emergency Medicine | Admitting: Emergency Medicine

## 2016-04-15 ENCOUNTER — Emergency Department (HOSPITAL_COMMUNITY): Payer: 59

## 2016-04-15 DIAGNOSIS — Z853 Personal history of malignant neoplasm of breast: Secondary | ICD-10-CM | POA: Diagnosis not present

## 2016-04-15 DIAGNOSIS — Z79899 Other long term (current) drug therapy: Secondary | ICD-10-CM | POA: Diagnosis not present

## 2016-04-15 DIAGNOSIS — I10 Essential (primary) hypertension: Secondary | ICD-10-CM | POA: Insufficient documentation

## 2016-04-15 DIAGNOSIS — R070 Pain in throat: Secondary | ICD-10-CM | POA: Diagnosis not present

## 2016-04-15 MED ORDER — SUCRALFATE 1 GM/10ML PO SUSP: 1.0000 g | Freq: Three times a day (TID) | ORAL | 0 refills | Status: DC

## 2016-04-15 MED ORDER — GI COCKTAIL ~~LOC~~
30.0000 mL | Freq: Once | ORAL | Status: AC
  Administered 2016-04-15: 30 mL via ORAL
  Filled 2016-04-15: qty 30

## 2016-04-15 NOTE — ED Triage Notes (Signed)
Pt states for the past week she has been having pressure and pain in the left side of her throat   Pt states she has had a mild resp infection for the past month  Pt states she went to urgent care on Thursday and was given amoxicillin and has been taking that three times a day since but the feeling has not changed  Pt states tonight when she went to bed she felt like she was choking and when she got up she felt sick so she went to the bathroom and spit up some undigested food from her throat area

## 2016-04-15 NOTE — ED Provider Notes (Signed)
[name]  CSN: [id number] Arrival date & time: 04/15/16  0309  History   Chief Complaint Chief Complaint  Patient presents with  . throat issue   HPI Tina [name] is a 60 y.o. female with pmh noted below presents with left sided throat discomfort associated with difficulty swallowing solids and liquids and regurgitation of undigested food, this started 10 days ago.  Pt describes throat discomfort as a "tearing" sensation with sensation of food getting stuck in her throat.  Pt reports more difficulty with swallowing foods than liquids.  Pt states laying down and walking makes throat discomfort worse, pt reports drinking warm water provides some relief.  Pt is tolerating regular diet, able to keep things down without nausea or vomiting.  Pt reports she has had upper respiratory infection like symptoms (sneezing, runny nose, scratchy throat) x 1 month so she went to urgent care 5 days ago (04/10/16) - she was discharged with amoxicillin 1g three times daily.  Pt states her URI symptoms have improved since starting amoxicillin however her left sided throat discomfort is unchanged.   Pt has h/o GERD treated with omeprazole.  Pt denies new headaches, shortness of breath, chest pain, abdominal pain, n/v/c/d, fevers, recent unexpected weight loss, recent changes in voice.  No h/o tobacco use.  Pt tolerating regular diet and fluids.   HPI  Past Medical History:  Diagnosis Date  . Acid reflux   . Cancer ([place]) 2002   Breast cancer-Lobular carcinoma in situ  . Hyperlipidemia   . Hypertension   . Osteopenia 07/2011   t score -1.1 FRAX 13%/0.4%    Patient Active Problem List   Diagnosis Date Noted  . HTN (hypertension) 02/01/2012  . Other and unspecified hyperlipidemia 02/01/2012    Past Surgical History:  Procedure Laterality Date  . BREAST SURGERY  2004   excisional right breast surgery   . CHOLECYSTECTOMY  2003    OB History    Gravida Para Term Preterm AB  Living   2 2 2     2    SAB TAB Ectopic Multiple Live Births                   Home Medications    Prior to Admission medications   Medication Sig Start Date End Date Taking? Authorizing Provider  amoxicillin (AMOXIL) 500 MG capsule Take 2 capsules (1,000 mg total) by mouth 3 (three) times daily. 04/10/16 04/17/16 Yes Elizabeth Whitney McVey, PA-C  Calcium Carbonate-Vitamin D (CALCIUM + D PO) Take by mouth.   Yes Historical Provider, MD  Cholecalciferol (VITAMIN D PO) Take by mouth.   Yes Historical Provider, MD  losartan (COZAAR) 50 MG tablet Take 1 tablet (50 mg total) by mouth daily. 10/19/11  Yes Posey Boyer, MD  LOVAZA 1 G capsule Take 4 mg by mouth daily.  12/05/10  Yes Historical Provider, MD  MIRVASO 0.33 % GEL Apply 1 application topically daily. 03/24/16  Yes Historical Provider, MD  Multiple Vitamin (MULTIVITAMIN) tablet Take 1 tablet by mouth daily.   Yes Historical Provider, MD  omeprazole (PRILOSEC) 40 MG capsule Take 40 mg by mouth daily.   Yes Historical Provider, MD  triamcinolone cream (KENALOG) 0.1 % Apply 1 application topically daily as needed for rash. 03/24/16  Yes Historical Provider, MD    Family History Family History  Problem Relation Age of Onset  . Stroke Father 26  . Diabetes Father   . Hypertension Father   . Heart disease  Father     bypass  . Cancer Father     parotid cancer  . Cancer Mother 26    uterine  . Osteoporosis Mother   . Dementia Mother   . Diabetes Brother     type 2  . Diabetes Son 33    MODY  . Asthma Son   . Cancer Maternal Uncle     colon  . Cancer Paternal Uncle     colon    Social History Social History  Substance Use Topics  . Smoking status: Never Smoker  . Smokeless tobacco: Never Used  . Alcohol use 0.0 oz/week     Comment: rarely     Allergies   Patient has no known allergies.   Review of Systems Review of Systems  All other systems reviewed and are negative.    Physical Exam Updated Vital  Signs BP 131/85 (BP Location: Right Arm)   Pulse 61   Temp 97.4 F (36.3 C) (Oral)   Resp 18   LMP 05/05/2010   SpO2 97%   Physical Exam  Constitutional: She is oriented to person, place, and time. Vital signs are normal. She appears well-developed and well-nourished. No distress.  HENT:  Head: Normocephalic and atraumatic.  Right Ear: Tympanic membrane, external ear and ear canal normal. Tympanic membrane is not injected, not erythematous, not retracted and not bulging.  Left Ear: Tympanic membrane, external ear and ear canal normal. Tympanic membrane is not injected, not erythematous, not retracted and not bulging.  Mouth/Throat: Oropharynx is clear and moist. No oropharyngeal exudate.  Oropharynx without edema or erythema and exudates.  Uvula, tongue and trachea midline.  No pooling of oral secretion, no drooling or trismus.  Pt able to swallow oral secretions without difficulty.  No speech impediment, no hot potato voice.  No posterior midline neck tenderness, full neck ROM.  Mild tenderness at L anterior neck left of trachea - no masses, fluctuance noted on this area.  Thyroid non-palpable without nodules.  No cervical adenopathy.   Eyes: EOM are normal. Pupils are equal, round, and reactive to light.  Neck: Normal range of motion. Neck supple. No JVD present. No tracheal deviation present. No thyromegaly present.  Cardiovascular: Normal rate, regular rhythm and normal heart sounds.   No murmur heard. Pulmonary/Chest: Effort normal and breath sounds normal. No stridor. No respiratory distress.  Abdominal: Soft. There is no tenderness.  Musculoskeletal: Normal range of motion.  Lymphadenopathy:    She has no cervical adenopathy.  Neurological: She is alert and oriented to person, place, and time.  Skin: Skin is warm and dry. Capillary refill takes less than 2 seconds.  Psychiatric: She has a normal mood and affect. Her behavior is normal.  Nursing note and vitals reviewed.    ED  Treatments / Results  Labs (all labs ordered are listed, but only abnormal results are displayed) Labs Reviewed - No data to display  EKG  EKG Interpretation None       Radiology Dg Neck Soft Tissue  Result Date: 04/15/2016 CLINICAL DATA:  Foreign body sensation in her throat tonight. Sore throat for 1 week. EXAM: NECK SOFT TISSUES - 1+ VIEW COMPARISON:  None. FINDINGS: There is no evidence of retropharyngeal soft tissue swelling or epiglottic enlargement. The cervical airway is unremarkable and no radio-opaque foreign body identified. IMPRESSION: Negative. Electronically Signed   By: Andreas Newport M.D.   On: 04/15/2016 04:42    Procedures Procedures (including critical care time)  Medications Ordered in  ED Medications  gi cocktail (Maalox,Lidocaine,Donnatal) (30 mLs Oral Given 04/15/16 [id number])     Initial Impression / Assessment and Plan / ED Course  I have reviewed the triage vital signs and the nursing notes.  Pertinent labs & imaging results that were available during my care of the patient were reviewed by me and considered in my medical decision making (see chart for details).  Clinical Course    60 yo female with pertinent pmh of treated GERD (on omeprazole 40mg  daily) presents with L sided deep throat pain a/w pressure and regurgitation of undigested food x 10 days.  Pt currently being treated with amoxicillin 1g daily for otitis media diagnosed at urgent care on 04/10/16.  ENT exam today remarkable for L sided throat tenderness to deep palpation, otherwise normal - no masses, edema, erythema or fluctuance noted over area of pain, no trismus, no neck stiffness or decreased ROM, phonation intact, pt afebrile with other VSS, pt controlling oral secretions well, no increased breathing effort.  Initial ddx includes GERD, pill esophagitis, achalasia, diverticulum or malignancy. Pt has EGD 18 months ago which was remarkable for reflux but otherwise normal.  Soft tissue neck x-ray  today negative with no retropharyngeal soft tissue swelling or epiglottic enlargement, airway without foreign body and patent.  Given that VSS, ENT exam without red flags for RTP abscess, Ludwig's angina and pt is in no respiratory distress, controlling oral secretions well today - pt is considered stable for discharge with carafate suspension daily, close f/u with PCP and ENT referral for further discussion of symptoms and consideration of work up as outpatient.  Pt instructed to continue amoxicillin and omeprazole as prescribed.  Strict ED return instructions given, pt verbalized understanding and agreeable to dispo plan - pt states she will call ENT today to make appointment.    Final Clinical Impressions(s) / ED Diagnoses   Final diagnoses:  Throat discomfort    New Prescriptions Discharge Medication List as of 04/15/2016  6:31 AM    START taking these medications   Details  sucralfate (CARAFATE) 1 GM/10ML suspension Take 10 mLs (1 g total) by mouth 4 (four) times daily -  with meals and at bedtime., Starting Tue 04/15/2016, Print         Kinnie Feil, PA-C 04/16/16 2105    Kinnie Feil, PA-C 04/16/16 2106    Veatrice Kells, MD 04/16/16 2325

## 2016-04-15 NOTE — Discharge Instructions (Signed)
Your soft tissue imaging today was negative.  Please read attached clinical reference and information on difficulty swallowing.   Continue your antibiotics until you have completed the entire recommended dose and duration  Take Carafate suspension on an empty stomach and at bedtime up to four times a day, this may help with inflammation of stomach and esophagus   Follow up with Dr. Constance Holster (ENT) for further discussion of your symptoms and consideration of further work up as needed  Return to ED if you develop difficulty breathing or controlling your oral secretion, fevers, nausea or vomiting

## 2016-04-15 NOTE — ED Provider Notes (Signed)
Medical screening examination/treatment/procedure(s) were conducted as a shared visit with non-physician practitioner(s) and myself.  I personally evaluated the patient during the encounter.   EKG Interpretation None       Seen and examined.   Vitals:   04/15/16 0332 04/15/16 0540  BP: 147/93 128/79  Pulse: 81 63  Resp: 18 19  Temp: 97.7 F (36.5 C) 97.9 F (36.6 C)   NCAT  EOMI MMM, no trismus, intact phonation, Mallempati class 1.  No swelling of the lips tongue or uvula.   Neck is supple.  No stridor no bruits no anterior nor posterior cervical LAN RRR CTAB  Results for orders placed or performed in visit on 12/19/15  WET PREP FOR [place], YEAST, CLUE  Result Value Ref Range   Yeast Wet Prep HPF POC FEW (A) NONE SEEN   Trich, Wet Prep NONE SEEN NONE SEEN   Clue Cells Wet Prep HPF POC NONE SEEN NONE SEEN   WBC, Wet Prep HPF POC FEW NONE SEEN   Dg Neck Soft Tissue  Result Date: 04/15/2016 CLINICAL DATA:  Foreign body sensation in her throat tonight. Sore throat for 1 week. EXAM: NECK SOFT TISSUES - 1+ VIEW COMPARISON:  None. FINDINGS: There is no evidence of retropharyngeal soft tissue swelling or epiglottic enlargement. The cervical airway is unremarkable and no radio-opaque foreign body identified. IMPRESSION: Negative. Electronically Signed   By: Andreas Newport M.D.   On: 04/15/2016 04:42    Symptoms of coughing up food could represent Zenker's diverticulum. Or merely undigested food secondary to GERD.  Continue antibiotics as previously directed.  Will start Carafate QID and have patient follow up for recheck with her PMD and ENT as patient is having ongoing issues on antibiotics.       Veatrice Kells, MD 04/15/16 [phone]

## 2016-04-16 ENCOUNTER — Ambulatory Visit (INDEPENDENT_AMBULATORY_CARE_PROVIDER_SITE_OTHER): Payer: 59 | Admitting: Gynecology

## 2016-04-16 ENCOUNTER — Encounter: Payer: Self-pay | Admitting: Gynecology

## 2016-04-16 VITALS — BP 118/76

## 2016-04-16 DIAGNOSIS — N9089 Other specified noninflammatory disorders of vulva and perineum: Secondary | ICD-10-CM | POA: Diagnosis not present

## 2016-04-16 DIAGNOSIS — N952 Postmenopausal atrophic vaginitis: Secondary | ICD-10-CM

## 2016-04-16 LAB — WET PREP FOR TRICH, YEAST, CLUE
Clue Cells Wet Prep HPF POC: NONE SEEN
Trich, Wet Prep: NONE SEEN
Yeast Wet Prep HPF POC: NONE SEEN

## 2016-04-16 NOTE — Patient Instructions (Signed)
Office will call you with biopsy results 

## 2016-04-16 NOTE — Addendum Note (Signed)
Addended by: Nelva Nay on: 04/16/2016 09:26 AM   Modules accepted: Orders

## 2016-04-16 NOTE — Progress Notes (Signed)
    Tina [name] June 29, 1955 [id number]        60 y.o.  G2P2002 presents complaining of vaginal dryness and irritation. Has been so since she saw me in August. Was treated with Diflucan for yeast which did not seem to help. Did try OTC Replens which seemed to transiently helped for about a month or so. Noting very fragile tissues with tearing. Has used hydrocortisone cream and OTC antifungal switched transiently seems to help. No significant discharge or odor. Also notes color changes that oscillate where the labia will become white. Was wondering whether she had lichen sclerosis.  Past medical history,surgical history, problem list, medications, allergies, family history and social history were all reviewed and documented in the EPIC chart.  Directed ROS with pertinent positives and negatives documented in the history of present illness/assessment and plan.  Exam: [name] assistant Vitals:   04/16/16 0855  BP: 118/76   General appearance:  Normal Abdomen soft nontender without masses guarding rebound Pelvic external BUS vagina with fragile appearance general mild erythema.  No classic lichen sclerosis changes. Small white patch mid to upper right labia majora. Cervix atrophic. Uterus grossly normal midline mobile nontender. Adnexa without masses or tenderness. Physical Exam  Genitourinary:      Procedure: Skin overlying the white area was cleansed with Betadine, infiltrated with 1% lidocaine and the white area was excised in its entirety. Several nitrate applied afterwards for hemostasis. Patient tolerated well.   Assessment/Plan:  60 y.o. G2P2002 White patch right labia majora. Excised in its entirety. Differential reviewed to include dermatitis, viral,VIN,atypical lichen sclerosis. Patient will follow up for biopsy results. Discussed the general fragile nature of her vulva. OTC products do not seem to be of benefit. Wet prep today is negative for fungal. Does not have significant  discharge or other changes to suggest yeast. I think most of her issues are atrophic. Discussed use of topical estrogen in breast cancer patient and risks of absorption with systemic exposure. She would like to try to avoid this. Discussed possible trial of higher dose steroid as she does seem to get some benefit from OTC hydrocortisone regardless of biopsy results. Will rediscuss after biopsy results return.  Greater than 50% of my time was spent in direct face to face counseling and coordination of care with the patient.     Anastasio Auerbach MD, 9:06 AM 04/16/2016

## 2016-04-18 ENCOUNTER — Other Ambulatory Visit: Payer: Self-pay | Admitting: Otolaryngology

## 2016-04-18 DIAGNOSIS — R1313 Dysphagia, pharyngeal phase: Secondary | ICD-10-CM

## 2016-04-18 LAB — PATHOLOGY

## 2016-04-21 ENCOUNTER — Other Ambulatory Visit: Payer: Self-pay | Admitting: Gynecology

## 2016-04-21 ENCOUNTER — Encounter: Payer: Self-pay | Admitting: Gynecology

## 2016-04-21 MED ORDER — CLOBETASOL PROPIONATE 0.05 % EX CREA: TOPICAL_CREAM | CUTANEOUS | 1 refills | Status: DC

## 2016-04-25 ENCOUNTER — Ambulatory Visit
Admission: RE | Admit: 2016-04-25 | Discharge: 2016-04-25 | Disposition: A | Discharge status: Home / Self Care | Payer: 59 | Source: Ambulatory Visit | Attending: Otolaryngology | Admitting: Otolaryngology

## 2016-04-25 ENCOUNTER — Other Ambulatory Visit: Payer: 59

## 2016-04-25 DIAGNOSIS — R1313 Dysphagia, pharyngeal phase: Secondary | ICD-10-CM

## 2016-05-12 HISTORY — PX: BREAST EXCISIONAL BIOPSY: SUR124

## 2016-05-22 ENCOUNTER — Other Ambulatory Visit: Payer: Self-pay | Admitting: Gynecology

## 2016-05-22 ENCOUNTER — Encounter: Payer: Self-pay | Admitting: Gynecology

## 2016-05-22 NOTE — Telephone Encounter (Signed)
Okay to arrange for the patient

## 2016-05-26 ENCOUNTER — Telehealth: Payer: Self-pay | Admitting: *Deleted

## 2016-05-26 ENCOUNTER — Other Ambulatory Visit: Payer: Self-pay | Admitting: Gynecology

## 2016-05-26 DIAGNOSIS — Z1231 Encounter for screening mammogram for malignant neoplasm of breast: Secondary | ICD-10-CM

## 2016-05-26 DIAGNOSIS — Z86000 Personal history of in-situ neoplasm of breast: Secondary | ICD-10-CM

## 2016-05-26 NOTE — Telephone Encounter (Signed)
Appointment 06/25/16 @ 11:50am at [facility]

## 2016-05-26 NOTE — Telephone Encounter (Signed)
Dr.Fontaine okayed for pt to have MRI breast w/wo contrast order placed at [facility] I gave her the number to call and schedule.

## 2016-06-05 ENCOUNTER — Telehealth: Payer: Self-pay

## 2016-06-05 NOTE — Telephone Encounter (Signed)
I spoke with Tina [name]. And used code [id number] and icd-10 code z86.000 and received prior authorization [id number].  Added to appt notes.

## 2016-06-06 ENCOUNTER — Encounter: Payer: Self-pay | Admitting: Gynecology

## 2016-06-23 ENCOUNTER — Ambulatory Visit
Admission: RE | Admit: 2016-06-23 | Discharge: 2016-06-23 | Disposition: A | Discharge status: Home / Self Care | Payer: 59 | Source: Ambulatory Visit | Attending: Gynecology | Admitting: Gynecology

## 2016-06-23 DIAGNOSIS — Z1231 Encounter for screening mammogram for malignant neoplasm of breast: Secondary | ICD-10-CM

## 2016-06-25 ENCOUNTER — Other Ambulatory Visit: Payer: Self-pay | Admitting: Gynecology

## 2016-06-25 ENCOUNTER — Ambulatory Visit
Admission: RE | Admit: 2016-06-25 | Discharge: 2016-06-25 | Disposition: A | Discharge status: Home / Self Care | Payer: 59 | Source: Ambulatory Visit | Attending: Gynecology | Admitting: Gynecology

## 2016-06-25 DIAGNOSIS — J0140 Acute pansinusitis, unspecified: Secondary | ICD-10-CM | POA: Diagnosis not present

## 2016-06-25 DIAGNOSIS — R928 Other abnormal and inconclusive findings on diagnostic imaging of breast: Secondary | ICD-10-CM

## 2016-06-25 DIAGNOSIS — Z853 Personal history of malignant neoplasm of breast: Secondary | ICD-10-CM | POA: Diagnosis not present

## 2016-06-25 DIAGNOSIS — Z86000 Personal history of in-situ neoplasm of breast: Secondary | ICD-10-CM

## 2016-06-25 MED ORDER — GADOBENATE DIMEGLUMINE 529 MG/ML IV SOLN: 17.0000 mL | Freq: Once | INTRAVENOUS | Status: DC | PRN

## 2016-06-25 MED ORDER — GADOBENATE DIMEGLUMINE 529 MG/ML IV SOLN
17.0000 mL | Freq: Once | INTRAVENOUS | Status: AC | PRN
  Administered 2016-06-25: 17 mL via INTRAVENOUS

## 2016-06-28 ENCOUNTER — Encounter: Payer: Self-pay | Admitting: Gynecology

## 2016-06-30 ENCOUNTER — Other Ambulatory Visit: Payer: Self-pay | Admitting: Gynecology

## 2016-06-30 ENCOUNTER — Ambulatory Visit
Admission: RE | Admit: 2016-06-30 | Discharge: 2016-06-30 | Disposition: A | Discharge status: Home / Self Care | Payer: 59 | Source: Ambulatory Visit | Attending: Gynecology | Admitting: Gynecology

## 2016-06-30 DIAGNOSIS — N6489 Other specified disorders of breast: Secondary | ICD-10-CM | POA: Diagnosis not present

## 2016-06-30 DIAGNOSIS — R921 Mammographic calcification found on diagnostic imaging of breast: Secondary | ICD-10-CM

## 2016-06-30 DIAGNOSIS — R928 Other abnormal and inconclusive findings on diagnostic imaging of breast: Secondary | ICD-10-CM

## 2016-06-30 NOTE — Telephone Encounter (Signed)
Dr. Dalbert Batman I believe is still operating. Otherwise any of the general surgeons are good as this is basic to their specialty

## 2016-07-08 ENCOUNTER — Ambulatory Visit: Payer: Self-pay | Admitting: Surgery

## 2016-07-08 DIAGNOSIS — N6320 Unspecified lump in the left breast, unspecified quadrant: Secondary | ICD-10-CM

## 2016-07-08 NOTE — H&P (Signed)
History of Present Illness (Tina Gravelle K. Santa Abdelrahman MD; 07/08/2016 4:10 PM) The patient is a 61 year old female who presents with a breast mass. Referred by Tina [name] and Tina [name] for left breast mass  This is a 61-year-old female who is 14 years status post right breast lumpectomy for florid hyperplasia. 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 This area was unable to be localized and so she did not have excision. She has been followed with MRI for the last several years. MRI remains negative. A recent screening mammogram showed a cluster of calcifications in the left upper outer quadrant. She underwent further workup and biopsies showed "mucocele like lesion with calcifications". She presents now for excision. A biopsy clip was placed at the time of her core biopsy.  She did breastfeed OCP 12 years No family history  CLINICAL DATA: Screening.  EXAM: 2D DIGITAL SCREENING BILATERAL MAMMOGRAM WITH CAD AND ADJUNCT TOMO  COMPARISON: Previous exam(s).  ACR Breast Density Category c: The breast tissue is heterogeneously dense, which may obscure small masses.  FINDINGS: In the left breast, a possible asymmetry and a separate cluster of calcifications warrant further evaluation. In the right breast, no findings suspicious for malignancy. Images were processed with CAD.  IMPRESSION: Further evaluation is suggested for possible asymmetry and a separate cluster of calcifications in the left breast.  RECOMMENDATION: Diagnostic mammogram and possibly ultrasound of the left breast. (Code:[id number])  The patient will be contacted regarding the findings, and additional imaging will be scheduled.  BI-RADS CATEGORY 0: Incomplete. Need additional imaging evaluation and/or prior mammograms for comparison.   Electronically Signed By: Tina [name] M.D. On: 06/24/2016 11:03  CLINICAL DATA: Screening recall for possible left breast asymmetry and possible  calcifications in the left breast. Patient has a history of left breast lobular neoplasia. Recent high risk screening MRI 06/25/2016 was negative.  EXAM: 2D DIGITAL DIAGNOSTIC UNILATERAL LEFT MAMMOGRAM WITH CAD AND ADJUNCT TOMO  LEFT BREAST ULTRASOUND  COMPARISON: Previous exam(s).  ACR Breast Density Category c: The breast tissue is heterogeneously dense, which may obscure small masses.  FINDINGS: An ML view as well as magnification views and spot compression CC and MLO views were performed of the left breast. The initially questioned possible left breast asymmetry appears to resolve with the heterogeneous fibroglandular pattern in the left breast unchanged when compared to prior remote mammograms dating back to 2007. The spot compression magnification views of the upper-outer left breast demonstrate a 5 mm group of coarse heterogeneous calcifications. Additional punctate calcifications are seen throughout the outer left breast.  Mammographic images were processed with CAD.  Targeted ultrasound of the upper left breast was performed. No suspicious masses or abnormalities are seen, only tiny areas of fibrocystic change are visualized.  IMPRESSION: Indeterminate calcifications in the upper-outer left breast.  RECOMMENDATION: Stereotactic guided biopsy of the calcifications in the upper-outer left breast is recommended. This is being scheduled for the patient.  I have discussed the findings and recommendations with the patient. Results were also provided in writing at the conclusion of the visit. If applicable, a reminder letter will be sent to the patient regarding the next appointment.  BI-RADS CATEGORY 4: Suspicious.   Electronically Signed By: Tina [name] M.D. On: 06/30/2016 12:00  CLINICAL DATA: 60-year-old female with history of prior left breast stereotactic guided biopsy in 2003 with LCIS. Patient presents with indeterminate calcifications in the  upper-outer left breast.  EXAM: LEFT BREAST STEREOTACTIC CORE NEEDLE BIOPSY    COMPARISON: Previous exams.  FINDINGS: The patient and I discussed the procedure of stereotactic-guided biopsy including benefits and alternatives. We discussed the high likelihood of a successful procedure. We discussed the risks of the procedure including infection, bleeding, tissue injury, clip migration, and inadequate sampling. Informed written consent was given. The usual time out protocol was performed immediately prior to the procedure.  Using sterile technique and 1% Lidocaine as local anesthetic, under stereotactic guidance, a 9 gauge vacuum device was used to perform core needle biopsy of the calcifications in the upper-outer left breast using a superior to inferior approach. Specimen radiograph was performed showing the presence of calcifications. Specimens with calcifications are identified for pathology.  At the conclusion of the procedure, an X shaped tissue marker clip was deployed into the biopsy cavity. Follow-up 2-view mammogram was performed and dictated separately.  IMPRESSION: Stereotactic-guided biopsy of calcifications in the upper-outer left breast. No apparent complications.  Electronically Signed: By: Tina [name] M.D. On: 06/30/2016 14:34  Pathology revealed MUCOCELE LIKE LESION WITH CALCIFICATIONS of the Left breast, upper outer. This was found to be concordant by Dr. Jennifer [name], with excision recommended. Pathology results were discussed with the patient by telephone. The patient reported doing well after the biopsy with tenderness at the site. Post biopsy instructions and care were reviewed and questions were answered. The patient was encouraged to call [facility] for any additional concerns. Surgical consultation has been arranged with Dr. Leverne Tessler at [facility] on July 07, 2016.  Pathology results  reported by Tina Bailey, RN on 07/01/2016.   Electronically Signed By: Tina [name] M.D. On: 07/01/2016 14:38   Past Surgical History (Tina [name], CMA; 07/08/2016 3:19 PM) Breast Biopsy Bilateral. Colon Polyp Removal - Colonoscopy Gallbladder Surgery - Laparoscopic Oral Surgery  Diagnostic Studies History (Tina [name], CMA; 07/08/2016 3:19 PM) Colonoscopy 1-5 years ago Mammogram within last year Pap Smear 1-5 years ago  Allergies (Tina [name], CMA; 07/08/2016 3:20 PM) No Known Drug Allergies 07/08/2016  Medication History (Tina [name], CMA; 07/08/2016 3:21 PM) Losartan Potassium (50MG Tablet, Oral daily) Active. Omeprazole (40MG Capsule DR, Oral daily) Active. Clobetasol Propionate (0.05% Cream, External as needed) Active. Calcium + D (150-261-330MG-MG-IU Capsule, Oral daily) Active. Vitamin D (Cholecalciferol) (1000UNIT Capsule, Oral daily) Active. Multi-Day (Oral daily) Active. Medications Reconciled  Social History (Tina [name], CMA; 07/08/2016 3:19 PM) Alcohol use Occasional alcohol use. Caffeine use Carbonated beverages, Tea. No drug use Tobacco use Never smoker.  Family History (Tina [name], CMA; 07/08/2016 3:19 PM) Cancer Father, Mother. Diabetes Mellitus Brother, Father, Son. Heart Disease Father. Hypertension Father, Mother.  Pregnancy / Birth History (Tina [name], CMA; 07/08/2016 3:19 PM) Age at menarche 10 years. Age of menopause [age] Contraceptive History Oral contraceptives. Gravida 2 Length (months) of breastfeeding 12-24 Maternal age 26-30 Para 2 Regular periods  Other Problems (Tina [name], CMA; 07/08/2016 3:19 PM) Breast Cancer Cholelithiasis Gastroesophageal Reflux Disease High blood pressure Hypercholesterolemia     Review of Systems (Tina [name] CMA; 07/08/2016 3:19 PM) General Not Present- Appetite Loss, Chills, Fatigue, Fever, Night Sweats, Weight Gain and Weight Loss. Skin Not  Present- Change in Wart/Mole, Dryness, Hives, Jaundice, New Lesions, Non-Healing Wounds, Rash and Ulcer. HEENT Present- Seasonal Allergies. Not Present- Earache, Hearing Loss, Hoarseness, Nose Bleed, Oral Ulcers, Ringing in the Ears, Sinus Pain, Sore Throat, Visual Disturbances, Wears glasses/contact lenses and Yellow Eyes. Respiratory Not Present- Bloody sputum, Chronic Cough, Difficulty Breathing, Snoring and Wheezing. Breast Not Present- Breast Mass, Breast Pain, Nipple Discharge and   Skin Changes. Cardiovascular Not Present- Chest Pain, Difficulty Breathing Lying Down, Leg Cramps, Palpitations, Rapid Heart Rate, Shortness of Breath and Swelling of Extremities. Gastrointestinal Not Present- Abdominal Pain, Bloating, Bloody Stool, Change in Bowel Habits, Chronic diarrhea, Constipation, Difficulty Swallowing, Excessive gas, Gets full quickly at meals, Hemorrhoids, Indigestion, Nausea, Rectal Pain and Vomiting. Female Genitourinary Not Present- Frequency, Nocturia, Painful Urination, Pelvic Pain and Urgency. Musculoskeletal Not Present- Back Pain, Joint Pain, Joint Stiffness, Muscle Pain, Muscle Weakness and Swelling of Extremities. Neurological Not Present- Decreased Memory, Fainting, Headaches, Numbness, Seizures, Tingling, Tremor, Trouble walking and Weakness. Psychiatric Not Present- Anxiety, Bipolar, Change in Sleep Pattern, Depression, Fearful and Frequent crying. Endocrine Not Present- Cold Intolerance, Excessive Hunger, Hair Changes, Heat Intolerance, Hot flashes and New Diabetes. Hematology Not Present- Blood Thinners, Easy Bruising, Excessive bleeding, Gland problems, HIV and Persistent Infections.  Vitals (Tina [name] CMA; 07/08/2016 3:22 PM) 07/08/2016 3:21 PM Weight: 189.2 lb Height: 67in Body Surface Area: 1.97 m Body Mass Index: 29.63 kg/m  Temp.: 98.1F  Pulse: 60 (Regular)  BP: 124/84 (Sitting, Left Arm, Standard)      Physical Exam (Tallyn Holroyd K. Payten Hobin MD;  07/08/2016 4:10 PM)  The physical exam findings are as follows: Note:WDWN in NAD Eyes: Pupils equal, round; sclera anicteric HENT: Oral mucosa moist; good dentition Neck: No masses palpated, no thyromegaly Lungs: CTA bilaterally; normal respiratory effort Breasts: symmetric; healed incision in right lower outer quadrant No axillary lymphadenopathy No palpable masses in either breast No nipple retraction or discharge CV: Regular rate and rhythm; no murmurs; extremities well-perfused with no edema Abd: +bowel sounds, soft, non-tender, no palpable organomegaly; no palpable hernias Skin: Warm, dry; no sign of jaundice Psychiatric - alert and oriented x 4; calm mood and affect    Assessment & Plan (Donia Yokum K. Kratos Ruscitti MD; 07/08/2016 4:11 PM)  LEFT BREAST MASS (N63.20) Impression: Mucocele like lesion with calcifications  Current Plans Schedule for Surgery - Left radioactive seed localized lumpectomy. The surgical procedure has been discussed with the patient. Potential risks, benefits, alternative treatments, and expected outcomes have been explained. All of the patient's questions at this time have been answered. The likelihood of reaching the patient's treatment goal is good. The patient understand the proposed surgical procedure and wishes to proceed. 

## 2016-07-09 ENCOUNTER — Other Ambulatory Visit: Payer: Self-pay | Admitting: Surgery

## 2016-07-09 DIAGNOSIS — N6320 Unspecified lump in the left breast, unspecified quadrant: Secondary | ICD-10-CM

## 2016-07-15 ENCOUNTER — Encounter (HOSPITAL_COMMUNITY)
Admission: RE | Admit: 2016-07-15 | Discharge: 2016-07-15 | Disposition: A | Discharge status: Home / Self Care | Payer: 59 | Source: Ambulatory Visit | Attending: Surgery | Admitting: Surgery

## 2016-07-15 ENCOUNTER — Encounter (HOSPITAL_COMMUNITY): Payer: Self-pay

## 2016-07-15 ENCOUNTER — Other Ambulatory Visit: Payer: Self-pay

## 2016-07-15 DIAGNOSIS — Z01812 Encounter for preprocedural laboratory examination: Secondary | ICD-10-CM | POA: Insufficient documentation

## 2016-07-15 DIAGNOSIS — Z01818 Encounter for other preprocedural examination: Secondary | ICD-10-CM | POA: Insufficient documentation

## 2016-07-15 DIAGNOSIS — N6320 Unspecified lump in the left breast, unspecified quadrant: Secondary | ICD-10-CM | POA: Diagnosis not present

## 2016-07-15 LAB — CBC
HCT: 40.5 % (ref 36.0–46.0)
Hemoglobin: 13.5 g/dL (ref 12.0–15.0)
MCH: 29.7 pg (ref 26.0–34.0)
MCHC: 33.3 g/dL (ref 30.0–36.0)
MCV: 89.2 fL (ref 78.0–100.0)
Platelets: 253 10*3/uL (ref 150–400)
RBC: 4.54 MIL/uL (ref 3.87–5.11)
RDW: 13.5 % (ref 11.5–15.5)
WBC: 7.8 10*3/uL (ref 4.0–10.5)

## 2016-07-15 LAB — BASIC METABOLIC PANEL
Anion gap: 9 (ref 5–15)
BUN: 15 mg/dL (ref 6–20)
CO2: 26 mmol/L (ref 22–32)
Calcium: 9.5 mg/dL (ref 8.9–10.3)
Chloride: 106 mmol/L (ref 101–111)
Creatinine, Ser: 0.82 mg/dL (ref 0.44–1.00)
GFR calc Af Amer: >60 mL/min (ref >60)
GFR calc non Af Amer: >60 mL/min (ref >60)
Glucose, Bld: 104 mg/dL — ABNORMAL HIGH (ref 65–99)
Potassium: 3.6 mmol/L (ref 3.5–5.1)
Sodium: 141 mmol/L (ref 135–145)

## 2016-07-15 MED ORDER — CHLORHEXIDINE GLUCONATE CLOTH 2 % EX PADS: 6.0000 | MEDICATED_PAD | Freq: Once | CUTANEOUS | Status: DC

## 2016-07-15 NOTE — Progress Notes (Addendum)
PCP: Dr. Marton Redwood  Cardiologist: pt denies  EKG: pt denies  Stress test: pt denies  ECHO: pt denies  Cardiac Cath: pt denies   Chest X-ray: pt denies past year

## 2016-07-15 NOTE — Pre-Procedure Instructions (Signed)
    Tina [name]  07/15/2016      [id number] [address], [place] [address] [address] Phone: [phone] Fax: [phone]  [facility] [address] [place], [place] [address] Phone: [phone] Fax: [phone]  [place], [place] [facility] [address] [place] [address] [place] [address] Phone: [phone] Fax: [phone]  [facility] [address], [place] [address] Phone: [phone] Fax: [phone]    Your procedure is scheduled on Tues. Mar. 13 @ 715 AM.  Report to [facility] Admitting at 515 AM.  Call this number if you have problems the morning of surgery:  [phone]   Remember:  Do not eat food or drink liquids after midnight.  Take these medicines the morning of surgery with A SIP OF WATER acetaminophen (tylenol) if needed, omeprazole (prilosec)  Stop taking lovaza, ibuprofen (advil, Motrin). Stop taking any herbal medications or vitamins, Goody's, BC's.   Do not wear jewelry, make-up or nail polish.  Do not wear lotions, powders, or perfumes, or deoderant.  Do not shave 48 hours prior to surgery.  Men may shave face and neck.  Do not bring valuables to the hospital.  [facility] is not responsible for any belongings or valuables.  Contacts, dentures or bridgework may not be worn into surgery.  Leave your suitcase in the car.  After surgery it may be brought to your room.  For patients admitted to the hospital, discharge time will be determined by your treatment team.  Patients discharged the day of surgery will not be allowed to drive home.    Special instructions:  See attached  Please read over the following fact sheets that you were given. Pain Booklet, Coughing and Deep Breathing and  Surgical Site Infection Prevention

## 2016-07-16 NOTE — Progress Notes (Signed)
Anesthesia Chart Review:  Pt is a 61 year old female scheduled for L breast lumpectomy with radioactive seed localization on 07/22/2016 with Donnie Mesa, MD.    PMH includes:  HTN, hyperlipidemia, breast cancer, GERD. Never smoker. BMI 29  Medications include: losartan, lovaza, prilosec.   Preoperative labs reviewed.    EKG 07/15/16: NSR. Low voltage QRS. Septal infarct, age undetermined. Appears stable when compared to EKG 06/17/01.   If no changes, I anticipate pt can proceed with surgery as scheduled.   Willeen Cass, FNP-BC [facility]/Anesthesiology Phone: [phone] 07/16/2016 2:27 PM

## 2016-07-21 ENCOUNTER — Ambulatory Visit
Admission: RE | Admit: 2016-07-21 | Discharge: 2016-07-21 | Disposition: A | Discharge status: Home / Self Care | Payer: 59 | Source: Ambulatory Visit | Attending: Surgery | Admitting: Surgery

## 2016-07-21 DIAGNOSIS — R928 Other abnormal and inconclusive findings on diagnostic imaging of breast: Secondary | ICD-10-CM | POA: Diagnosis not present

## 2016-07-21 DIAGNOSIS — N6320 Unspecified lump in the left breast, unspecified quadrant: Secondary | ICD-10-CM

## 2016-07-21 NOTE — Anesthesia Preprocedure Evaluation (Addendum)
Anesthesia Evaluation  Patient identified by MRN, date of birth, ID band Patient awake    Reviewed: Allergy & Precautions, H&P , NPO status , Patient's Chart, lab work & pertinent test results  Airway Mallampati: III  TM Distance: >3 FB     Dental no notable dental hx. (+) Teeth Intact, Dental Advisory Given   Pulmonary neg pulmonary ROS,    Pulmonary exam normal breath sounds clear to auscultation       Cardiovascular Exercise Tolerance: Good hypertension, Pt. on medications  Rhythm:Regular Rate:Normal     Neuro/Psych negative neurological ROS  negative psych ROS   GI/Hepatic Neg liver ROS, GERD  Medicated and Controlled,  Endo/Other  negative endocrine ROS  Renal/GU negative Renal ROS  negative genitourinary   Musculoskeletal   Abdominal   Peds  Hematology negative hematology ROS (+)   Anesthesia Other Findings   Reproductive/Obstetrics negative OB ROS                           Anesthesia Physical Anesthesia Plan  ASA: II  Anesthesia Plan: General   Post-op Pain Management:    Induction: Intravenous  Airway Management Planned: LMA  Additional Equipment:   Intra-op Plan:   Post-operative Plan: Extubation in OR  Informed Consent: I have reviewed the patients History and Physical, chart, labs and discussed the procedure including the risks, benefits and alternatives for the proposed anesthesia with the patient or authorized representative who has indicated his/her understanding and acceptance.   Dental advisory given  Plan Discussed with: CRNA  Anesthesia Plan Comments:         Anesthesia Quick Evaluation

## 2016-07-22 ENCOUNTER — Ambulatory Visit
Admission: RE | Admit: 2016-07-22 | Discharge: 2016-07-22 | Disposition: A | Discharge status: Home / Self Care | Payer: 59 | Source: Ambulatory Visit | Attending: Surgery | Admitting: Surgery

## 2016-07-22 ENCOUNTER — Encounter (HOSPITAL_COMMUNITY)
Admission: RE | Disposition: A | Discharge status: Home / Self Care | Payer: Self-pay | Source: Ambulatory Visit | Attending: Surgery

## 2016-07-22 ENCOUNTER — Ambulatory Visit (HOSPITAL_COMMUNITY)
Admission: RE | Admit: 2016-07-22 | Discharge: 2016-07-22 | Disposition: A | Discharge status: Home / Self Care | Payer: 59 | Source: Ambulatory Visit | Attending: Surgery | Admitting: Surgery

## 2016-07-22 ENCOUNTER — Ambulatory Visit (HOSPITAL_COMMUNITY): Payer: 59 | Admitting: Anesthesiology

## 2016-07-22 ENCOUNTER — Ambulatory Visit (HOSPITAL_COMMUNITY): Payer: 59 | Admitting: Emergency Medicine

## 2016-07-22 DIAGNOSIS — I10 Essential (primary) hypertension: Secondary | ICD-10-CM | POA: Insufficient documentation

## 2016-07-22 DIAGNOSIS — D242 Benign neoplasm of left breast: Secondary | ICD-10-CM | POA: Diagnosis not present

## 2016-07-22 DIAGNOSIS — K219 Gastro-esophageal reflux disease without esophagitis: Secondary | ICD-10-CM | POA: Diagnosis not present

## 2016-07-22 DIAGNOSIS — N6092 Unspecified benign mammary dysplasia of left breast: Secondary | ICD-10-CM | POA: Diagnosis not present

## 2016-07-22 DIAGNOSIS — R921 Mammographic calcification found on diagnostic imaging of breast: Secondary | ICD-10-CM | POA: Diagnosis not present

## 2016-07-22 DIAGNOSIS — N6012 Diffuse cystic mastopathy of left breast: Secondary | ICD-10-CM | POA: Diagnosis not present

## 2016-07-22 DIAGNOSIS — N6320 Unspecified lump in the left breast, unspecified quadrant: Secondary | ICD-10-CM

## 2016-07-22 DIAGNOSIS — R928 Other abnormal and inconclusive findings on diagnostic imaging of breast: Secondary | ICD-10-CM | POA: Diagnosis not present

## 2016-07-22 DIAGNOSIS — N6082 Other benign mammary dysplasias of left breast: Secondary | ICD-10-CM | POA: Diagnosis not present

## 2016-07-22 DIAGNOSIS — N6321 Unspecified lump in the left breast, upper outer quadrant: Secondary | ICD-10-CM | POA: Diagnosis present

## 2016-07-22 DIAGNOSIS — Z79899 Other long term (current) drug therapy: Secondary | ICD-10-CM | POA: Diagnosis not present

## 2016-07-22 HISTORY — PX: BREAST LUMPECTOMY WITH RADIOACTIVE SEED LOCALIZATION: SHX6424

## 2016-07-22 SURGERY — BREAST LUMPECTOMY WITH RADIOACTIVE SEED LOCALIZATION
Anesthesia: General | Site: Breast | Laterality: Left

## 2016-07-22 MED ORDER — ONDANSETRON HCL 4 MG/2ML IJ SOLN
INTRAMUSCULAR | Status: DC | PRN
  Administered 2016-07-22: 4 mg via INTRAVENOUS

## 2016-07-22 MED ORDER — CEFAZOLIN SODIUM-DEXTROSE 2-4 GM/100ML-% IV SOLN
2.0000 g | INTRAVENOUS | Status: AC
  Administered 2016-07-22: 2 g via INTRAVENOUS
  Filled 2016-07-22: qty 100

## 2016-07-22 MED ORDER — FENTANYL CITRATE (PF) 100 MCG/2ML IJ SOLN
INTRAMUSCULAR | Status: DC | PRN
  Administered 2016-07-22 (×2): 50 ug via INTRAVENOUS

## 2016-07-22 MED ORDER — CELECOXIB 200 MG PO CAPS
400.0000 mg | ORAL_CAPSULE | ORAL | Status: AC
  Administered 2016-07-22: 200 mg via ORAL
  Filled 2016-07-22: qty 2

## 2016-07-22 MED ORDER — PROPOFOL 10 MG/ML IV BOLUS
INTRAVENOUS | Status: DC | PRN
  Administered 2016-07-22: 160 mg via INTRAVENOUS

## 2016-07-22 MED ORDER — BUPIVACAINE HCL (PF) 0.25 % IJ SOLN
INTRAMUSCULAR | Status: AC
  Filled 2016-07-22: qty 30

## 2016-07-22 MED ORDER — LIDOCAINE HCL (CARDIAC) 20 MG/ML IV SOLN
INTRAVENOUS | Status: DC | PRN
  Administered 2016-07-22: 60 mg via INTRAVENOUS

## 2016-07-22 MED ORDER — LACTATED RINGERS IV SOLN
INTRAVENOUS | Status: DC | PRN
  Administered 2016-07-22: 07:00:00 via INTRAVENOUS

## 2016-07-22 MED ORDER — MIDAZOLAM HCL 2 MG/2ML IJ SOLN
INTRAMUSCULAR | Status: AC
  Filled 2016-07-22: qty 2

## 2016-07-22 MED ORDER — BUPIVACAINE HCL (PF) 0.25 % IJ SOLN
INTRAMUSCULAR | Status: DC | PRN
  Administered 2016-07-22: 10 mL

## 2016-07-22 MED ORDER — HYDROCODONE-ACETAMINOPHEN 5-325 MG PO TABS: 1.0000 | ORAL_TABLET | Freq: Four times a day (QID) | ORAL | 0 refills | Status: DC | PRN

## 2016-07-22 MED ORDER — 0.9 % SODIUM CHLORIDE (POUR BTL) OPTIME
TOPICAL | Status: DC | PRN
  Administered 2016-07-22: 1000 mL

## 2016-07-22 MED ORDER — GABAPENTIN 300 MG PO CAPS
300.0000 mg | ORAL_CAPSULE | ORAL | Status: AC
  Administered 2016-07-22: 300 mg via ORAL
  Filled 2016-07-22: qty 1

## 2016-07-22 MED ORDER — MIDAZOLAM HCL 5 MG/5ML IJ SOLN
INTRAMUSCULAR | Status: DC | PRN
  Administered 2016-07-22: 2 mg via INTRAVENOUS

## 2016-07-22 MED ORDER — LIDOCAINE 2% (20 MG/ML) 5 ML SYRINGE
INTRAMUSCULAR | Status: AC
  Filled 2016-07-22: qty 5

## 2016-07-22 MED ORDER — EPHEDRINE SULFATE 50 MG/ML IJ SOLN
INTRAMUSCULAR | Status: DC | PRN
  Administered 2016-07-22: 10 mg via INTRAVENOUS
  Administered 2016-07-22: 5 mg via INTRAVENOUS
  Administered 2016-07-22: 10 mg via INTRAVENOUS

## 2016-07-22 MED ORDER — LIDOCAINE HCL (PF) 1 % IJ SOLN
INTRAMUSCULAR | Status: AC
  Filled 2016-07-22: qty 30

## 2016-07-22 MED ORDER — FENTANYL CITRATE (PF) 100 MCG/2ML IJ SOLN
INTRAMUSCULAR | Status: AC
  Filled 2016-07-22: qty 4

## 2016-07-22 MED ORDER — HYDROMORPHONE HCL 1 MG/ML IJ SOLN: 0.2500 mg | INTRAMUSCULAR | Status: DC | PRN

## 2016-07-22 MED ORDER — ACETAMINOPHEN 500 MG PO TABS
1000.0000 mg | ORAL_TABLET | ORAL | Status: AC
  Administered 2016-07-22: 1000 mg via ORAL
  Filled 2016-07-22: qty 2

## 2016-07-22 MED ORDER — PROPOFOL 10 MG/ML IV BOLUS
INTRAVENOUS | Status: AC
  Filled 2016-07-22: qty 20

## 2016-07-22 SURGICAL SUPPLY — 49 items
APPLIER CLIP 9.375 MED OPEN (MISCELLANEOUS) ×2
BENZOIN TINCTURE PRP APPL 2/3 (GAUZE/BANDAGES/DRESSINGS) ×2 IMPLANT
BINDER BREAST LRG (GAUZE/BANDAGES/DRESSINGS) IMPLANT
BINDER BREAST XLRG (GAUZE/BANDAGES/DRESSINGS) IMPLANT
BLADE SURG 15 STRL LF DISP TIS (BLADE) ×1 IMPLANT
BLADE SURG 15 STRL SS (BLADE) ×1
CANISTER SUCT 3000ML PPV (MISCELLANEOUS) ×2 IMPLANT
CHLORAPREP W/TINT 26ML (MISCELLANEOUS) ×2 IMPLANT
CLIP APPLIE 9.375 MED OPEN (MISCELLANEOUS) ×1 IMPLANT
CLSR STERI-STRIP ANTIMIC 1/2X4 (GAUZE/BANDAGES/DRESSINGS) ×2 IMPLANT
COVER PROBE W GEL 5X96 (DRAPES) ×2 IMPLANT
COVER SURGICAL LIGHT HANDLE (MISCELLANEOUS) ×2 IMPLANT
DEVICE DUBIN SPECIMEN MAMMOGRA (MISCELLANEOUS) ×2 IMPLANT
DRAPE CHEST BREAST 15X10 FENES (DRAPES) ×2 IMPLANT
DRAPE UTILITY XL STRL (DRAPES) ×2 IMPLANT
DRSG TEGADERM 4X4.75 (GAUZE/BANDAGES/DRESSINGS) ×2 IMPLANT
ELECT BLADE 4.0 EZ CLEAN MEGAD (MISCELLANEOUS) ×2
ELECT CAUTERY BLADE 6.4 (BLADE) ×2 IMPLANT
ELECT REM PT RETURN 9FT ADLT (ELECTROSURGICAL) ×2
ELECTRODE BLDE 4.0 EZ CLN MEGD (MISCELLANEOUS) ×1 IMPLANT
ELECTRODE REM PT RTRN 9FT ADLT (ELECTROSURGICAL) ×1 IMPLANT
GAUZE SPONGE 2X2 8PLY STRL LF (GAUZE/BANDAGES/DRESSINGS) ×1 IMPLANT
GLOVE BIO SURGEON STRL SZ7 (GLOVE) ×2 IMPLANT
GLOVE BIOGEL PI IND STRL 7.5 (GLOVE) ×1 IMPLANT
GLOVE BIOGEL PI INDICATOR 7.5 (GLOVE) ×1
GOWN STRL REUS W/ TWL LRG LVL3 (GOWN DISPOSABLE) ×3 IMPLANT
GOWN STRL REUS W/TWL LRG LVL3 (GOWN DISPOSABLE) ×3
ILLUMINATOR WAVEGUIDE N/F (MISCELLANEOUS) ×2 IMPLANT
KIT BASIN OR (CUSTOM PROCEDURE TRAY) ×2 IMPLANT
KIT MARKER MARGIN INK (KITS) ×2 IMPLANT
LIGHT WAVEGUIDE WIDE FLAT (MISCELLANEOUS) IMPLANT
NEEDLE 25GX 5/8IN NON SAFETY (NEEDLE) ×2 IMPLANT
NEEDLE HYPO 25X1 1.5 SAFETY (NEEDLE) IMPLANT
NS IRRIG 1000ML POUR BTL (IV SOLUTION) ×2 IMPLANT
PACK SURGICAL SETUP 50X90 (CUSTOM PROCEDURE TRAY) ×2 IMPLANT
PENCIL BUTTON HOLSTER BLD 10FT (ELECTRODE) ×2 IMPLANT
SPONGE GAUZE 2X2 STER 10/PKG (GAUZE/BANDAGES/DRESSINGS) ×1
SPONGE LAP 4X18 X RAY DECT (DISPOSABLE) ×2 IMPLANT
STRIP CLOSURE SKIN 1/2X4 (GAUZE/BANDAGES/DRESSINGS) ×2 IMPLANT
SUT MNCRL AB 4-0 PS2 18 (SUTURE) IMPLANT
SUT SILK 2 0 SH (SUTURE) IMPLANT
SUT VIC AB 3-0 SH 27 (SUTURE) ×1
SUT VIC AB 3-0 SH 27X BRD (SUTURE) ×1 IMPLANT
SYR BULB 3OZ (MISCELLANEOUS) ×2 IMPLANT
SYR CONTROL 10ML LL (SYRINGE) ×2 IMPLANT
TOWEL OR 17X24 6PK STRL BLUE (TOWEL DISPOSABLE) ×2 IMPLANT
TOWEL OR 17X26 10 PK STRL BLUE (TOWEL DISPOSABLE) ×2 IMPLANT
TUBE CONNECTING 12X1/4 (SUCTIONS) ×2 IMPLANT
YANKAUER SUCT BULB TIP NO VENT (SUCTIONS) ×2 IMPLANT

## 2016-07-22 NOTE — Interval H&P Note (Signed)
History and Physical Interval Note:  07/22/2016 7:01 AM  Tina [name]  has presented today for surgery, with the diagnosis of LEFT BREAST MASS  The various methods of treatment have been discussed with the patient and family. After consideration of risks, benefits and other options for treatment, the patient has consented to  Procedure(s): LEFT BREAST LUMPECTOMY WITH RADIOACTIVE SEED LOCALIZATION (Left) as a surgical intervention .  The patient's history has been reviewed, patient examined, no change in status, stable for surgery.  I have reviewed the patient's chart and labs.  Questions were answered to the patient's satisfaction.     Kemper Heupel K.

## 2016-07-22 NOTE — Op Note (Signed)
Preop diagnosis: Left breast mass Postop diagnosis: Same Procedure performed: Left radioactive seed localized lumpectomy Surgeon:  Kilah Drahos K. Anesthesia: Gen. LMA Indications: This is a 61 year old female who presented with an abnormal clustered calcifications in the left upper outer quadrant of her breast seen on recent mammogram. Biopsies showed "mucocele-like lesion with calcifications". Excision was recommended. A radioactive seed was placed by radiology yesterday next to the biopsy clip.  Description of procedure: The presence of the seed was confirmed in the preoperative area by using the neoprobe. The patient was brought to the operating room and placed in a supine position on the operating room table. After an adequate level of general anesthesia was obtained her left breast was prepped with ChloraPrep and draped in sterile fashion. A timeout was taken to ensure the proper patient and proper procedure. The seed is localized in the upper outer quadrant of the breast about 4 cm away from the nipple. We outlined a curvilinear incision around the edge of the areola.  We infiltrated this area with 0.25% Marcaine. Incision was made and we dissected laterally through the breast tissue towards the radioactive seed, using the neoprobe for guidance. When we were anterior to the seed we raise 4 margins around the area of activity. The specimen was lifted and we amputated the specimen using cautery. Specimen mammogram confirmed that the biopsy clip and the radioactive seed were within the center of the specimen. The specimen was oriented with a paint kit. The specimen was sent for pathology. There was no background activity. We irrigated the wound thoroughly and inspected for hemostasis. The wound was closed with a deep layer of 3-0 Vicryl and a subcuticular layer of 4-0 Monocryl. Benzoin Steri-Strips were applied. The patient was then extubated and brought to the recovery room in stable condition. All  sponge, instrument, and needle counts are correct.  Imogene Burn. Georgette Dover, MD, [facility]  General/ Trauma Surgery  07/22/2016 8:26 AM

## 2016-07-22 NOTE — Transfer of Care (Signed)
Immediate Anesthesia Transfer of Care Note  Patient: Tina [name]  Procedure(s) Performed: Procedure(s): LEFT BREAST LUMPECTOMY WITH RADIOACTIVE SEED LOCALIZATION (Left)  Patient Location: PACU  Anesthesia Type:General  Level of Consciousness: awake, alert  and oriented  Airway & Oxygen Therapy: Patient Spontanous Breathing and Patient connected to face mask oxygen  Post-op Assessment: Report given to RN and Post -op Vital signs reviewed and stable  Post vital signs: Reviewed and stable  Last Vitals:  Vitals:   07/22/16 0543 07/22/16 0835  BP: 120/71 137/86  Pulse: 66 73  Resp: 20 13  Temp: 36.4 C 36.4 C    Last Pain:  Vitals:   07/22/16 0543  TempSrc: Oral      Patients Stated Pain Goal: 3 (41/28/78 6767)  Complications: No apparent anesthesia complications

## 2016-07-22 NOTE — Discharge Instructions (Signed)
[facility] °Office Phone Number [phone] ° °BREAST BIOPSY/ PARTIAL MASTECTOMY: POST OP INSTRUCTIONS ° °Always review your discharge instruction sheet given to you by the facility where your surgery was performed. ° °IF YOU HAVE DISABILITY OR FAMILY LEAVE FORMS, YOU MUST BRING THEM TO THE OFFICE FOR PROCESSING.  DO NOT GIVE THEM TO YOUR DOCTOR. ° °1. A prescription for pain medication may be given to you upon discharge.  Take your pain medication as prescribed, if needed.  If narcotic pain medicine is not needed, then you may take acetaminophen (Tylenol) or ibuprofen (Advil) as needed. °2. Take your usually prescribed medications unless otherwise directed °3. If you need a refill on your pain medication, please contact your pharmacy.  They will contact our office to request authorization.  Prescriptions will not be filled after 5pm or on week-ends. °4. You should eat very light the first 24 hours after surgery, such as soup, crackers, pudding, etc.  Resume your normal diet the day after surgery. °5. Most patients will experience some swelling and bruising in the breast.  Ice packs and a good support bra will help.  Swelling and bruising can take several days to resolve.  °6. It is common to experience some constipation if taking pain medication after surgery.  Increasing fluid intake and taking a stool softener will usually help or prevent this problem from occurring.  A mild laxative (Milk of Magnesia or Miralax) should be taken according to package directions if there are no bowel movements after 48 hours. °7. Unless discharge instructions indicate otherwise, you may remove your bandages 24-48 hours after surgery, and you may shower at that time.  You may have steri-strips (small skin tapes) in place directly over the incision.  These strips should be left on the skin for 7-10 days.  If your surgeon used skin glue on the incision, you may shower in 24 hours.  The glue will flake off over the  next 2-3 weeks.  Any sutures or staples will be removed at the office during your follow-up visit. °8. ACTIVITIES:  You may resume regular daily activities (gradually increasing) beginning the next day.  Wearing a good support bra or sports bra minimizes pain and swelling.  You may have sexual intercourse when it is comfortable. °a. You may drive when you no longer are taking prescription pain medication, you can comfortably wear a seatbelt, and you can safely maneuver your car and apply brakes. °b. RETURN TO WORK:  ______________________________________________________________________________________ °9. You should see your doctor in the office for a follow-up appointment approximately two weeks after your surgery.  Your doctor’s nurse will typically make your follow-up appointment when she calls you with your pathology report.  Expect your pathology report 2-3 business days after your surgery.  You may call to check if you do not hear from us after three days. °10. OTHER INSTRUCTIONS: _______________________________________________________________________________________________ _____________________________________________________________________________________________________________________________________ °_____________________________________________________________________________________________________________________________________ °_____________________________________________________________________________________________________________________________________ ° °WHEN TO CALL YOUR DOCTOR: °1. Fever over 101.0 °2. Nausea and/or vomiting. °3. Extreme swelling or bruising. °4. Continued bleeding from incision. °5. Increased pain, redness, or drainage from the incision. ° °The clinic staff is available to answer your questions during regular business hours.  Please don’t hesitate to call and ask to speak to one of the nurses for clinical concerns.  If you have a medical emergency, go to the nearest  emergency room or call 911.  A surgeon from [facility] is always on call at the hospital. ° °For further questions, please visit [email or web address]  °

## 2016-07-22 NOTE — H&P (View-Only) (Signed)
History of Present Illness Tina [name]. Brenlyn Beshara MD; 07/08/2016 4:10 PM) The patient is a 61 year old female who presents with a breast mass. Referred by Abbie Sons and Lang Snow for left breast mass  This is a 61 year old female who is 14 years status post right breast lumpectomy for florid hyperplasia. 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 This area was unable to be localized and so she did not have excision. She has been followed with MRI for the last several years. MRI remains negative. A recent screening mammogram showed a cluster of calcifications in the left upper outer quadrant. She underwent further workup and biopsies showed "mucocele like lesion with calcifications". She presents now for excision. A biopsy clip was placed at the time of her core biopsy.  She did breastfeed OCP 12 years No family history  CLINICAL DATA: Screening.  EXAM: 2D DIGITAL SCREENING BILATERAL MAMMOGRAM WITH CAD AND ADJUNCT TOMO  COMPARISON: Previous exam(s).  ACR Breast Density Category c: The breast tissue is heterogeneously dense, which may obscure small masses.  FINDINGS: In the left breast, a possible asymmetry and a separate cluster of calcifications warrant further evaluation. In the right breast, no findings suspicious for malignancy. Images were processed with CAD.  IMPRESSION: Further evaluation is suggested for possible asymmetry and a separate cluster of calcifications in the left breast.  RECOMMENDATION: Diagnostic mammogram and possibly ultrasound of the left breast. (Code:[id number])  The patient will be contacted regarding the findings, and additional imaging will be scheduled.  BI-RADS CATEGORY 0: Incomplete. Need additional imaging evaluation and/or prior mammograms for comparison.   Electronically Signed By: Fidela Salisbury M.D. On: 06/24/2016 11:03  CLINICAL DATA: Screening recall for possible left breast asymmetry and possible  calcifications in the left breast. Patient has a history of left breast lobular neoplasia. Recent high risk screening MRI 06/25/2016 was negative.  EXAM: 2D DIGITAL DIAGNOSTIC UNILATERAL LEFT MAMMOGRAM WITH CAD AND ADJUNCT TOMO  LEFT BREAST ULTRASOUND  COMPARISON: Previous exam(s).  ACR Breast Density Category c: The breast tissue is heterogeneously dense, which may obscure small masses.  FINDINGS: An ML view as well as magnification views and spot compression CC and MLO views were performed of the left breast. The initially questioned possible left breast asymmetry appears to resolve with the heterogeneous fibroglandular pattern in the left breast unchanged when compared to prior remote mammograms dating back to 2007. The spot compression magnification views of the upper-outer left breast demonstrate a 5 mm group of coarse heterogeneous calcifications. Additional punctate calcifications are seen throughout the outer left breast.  Mammographic images were processed with CAD.  Targeted ultrasound of the upper left breast was performed. No suspicious masses or abnormalities are seen, only tiny areas of fibrocystic change are visualized.  IMPRESSION: Indeterminate calcifications in the upper-outer left breast.  RECOMMENDATION: Stereotactic guided biopsy of the calcifications in the upper-outer left breast is recommended. This is being scheduled for the patient.  I have discussed the findings and recommendations with the patient. Results were also provided in writing at the conclusion of the visit. If applicable, a reminder letter will be sent to the patient regarding the next appointment.  BI-RADS CATEGORY 4: Suspicious.   Electronically Signed By: Everlean Alstrom M.D. On: 06/30/2016 12:00  CLINICAL DATA: 61 year old female with history of prior left breast stereotactic guided biopsy in 2003 with LCIS. Patient presents with indeterminate calcifications in the  upper-outer left breast.  EXAM: LEFT BREAST STEREOTACTIC CORE NEEDLE BIOPSY  COMPARISON: Previous exams.  FINDINGS: The patient and I discussed the procedure of stereotactic-guided biopsy including benefits and alternatives. We discussed the high likelihood of a successful procedure. We discussed the risks of the procedure including infection, bleeding, tissue injury, clip migration, and inadequate sampling. Informed written consent was given. The usual time out protocol was performed immediately prior to the procedure.  Using sterile technique and 1% Lidocaine as local anesthetic, under stereotactic guidance, a 9 gauge vacuum device was used to perform core needle biopsy of the calcifications in the upper-outer left breast using a superior to inferior approach. Specimen radiograph was performed showing the presence of calcifications. Specimens with calcifications are identified for pathology.  At the conclusion of the procedure, an X shaped tissue marker clip was deployed into the biopsy cavity. Follow-up 2-view mammogram was performed and dictated separately.  IMPRESSION: Stereotactic-guided biopsy of calcifications in the upper-outer left breast. No apparent complications.  Electronically Signed: By: Everlean Alstrom M.D. On: 06/30/2016 14:34  Pathology revealed [place] of the Left breast, upper outer. This was found to be concordant by Dr. Everlean Alstrom, with excision recommended. Pathology results were discussed with the patient by telephone. The patient reported doing well after the biopsy with tenderness at the site. Post biopsy instructions and care were reviewed and questions were answered. The patient was encouraged to call [facility] for any additional concerns. Surgical consultation has been arranged with Dr. Donnie Mesa at [facility] on July 07, 2016.  Pathology results  reported by Terie Purser, RN on 07/01/2016.   Electronically Signed By: Everlean Alstrom M.D. On: 07/01/2016 14:38   Past Surgical History Nance Pear, [place]; 07/08/2016 3:19 PM) Breast Biopsy Bilateral. Colon Polyp Removal - Colonoscopy Gallbladder Surgery - Laparoscopic Oral Surgery  Diagnostic Studies History Nance Pear, [place]; 07/08/2016 3:19 PM) Colonoscopy 1-5 years ago Mammogram within last year Pap Smear 1-5 years ago  Allergies Nance Pear, CMA; 07/08/2016 3:20 PM) No Known Drug Allergies 07/08/2016  Medication History Nance Pear, CMA; 07/08/2016 3:21 PM) Losartan Potassium (50MG  Tablet, Oral daily) Active. Omeprazole (40MG  Capsule DR, Oral daily) Active. Clobetasol Propionate (0.05% Cream, External as needed) Active. Calcium + D (150-261-330MG -MG-IU Capsule, Oral daily) Active. Vitamin D (Cholecalciferol) (1000UNIT Capsule, Oral daily) Active. Multi-Day (Oral daily) Active. Medications Reconciled  Social History Nance Pear, [place]; 07/08/2016 3:19 PM) Alcohol use Occasional alcohol use. Caffeine use Carbonated beverages, Tea. No drug use Tobacco use Never smoker.  Family History Nance Pear, [place]; 07/08/2016 3:19 PM) Cancer Father, Mother. Diabetes Mellitus Brother, Father, Son. Heart Disease Father. Hypertension Father, Mother.  Pregnancy / Birth History Nance Pear, [place]; 07/08/2016 3:19 PM) Age at menarche 77 years. Age of menopause [age] Contraceptive History Oral contraceptives. Gravida 2 Length (months) of breastfeeding 12-24 Maternal age 64-30 Para 2 Regular periods  Other Problems Nance Pear, [place]; 07/08/2016 3:19 PM) Breast Cancer Cholelithiasis Gastroesophageal Reflux Disease High blood pressure Hypercholesterolemia     Review of Systems Nance Pear CMA; 07/08/2016 3:19 PM) General Not Present- Appetite Loss, Chills, Fatigue, Fever, Night Sweats, Weight Gain and Weight Loss. Skin Not  Present- Change in Wart/Mole, Dryness, Hives, Jaundice, New Lesions, Non-Healing Wounds, Rash and Ulcer. HEENT Present- Seasonal Allergies. Not Present- Earache, Hearing Loss, Hoarseness, Nose Bleed, Oral Ulcers, Ringing in the Ears, Sinus Pain, Sore Throat, Visual Disturbances, Wears glasses/contact lenses and Yellow Eyes. Respiratory Not Present- Bloody sputum, Chronic Cough, Difficulty Breathing, Snoring and Wheezing. Breast Not Present- Breast Mass, Breast Pain, Nipple Discharge and  Skin Changes. Cardiovascular Not Present- Chest Pain, Difficulty Breathing Lying Down, Leg Cramps, Palpitations, Rapid Heart Rate, Shortness of Breath and Swelling of Extremities. Gastrointestinal Not Present- Abdominal Pain, Bloating, Bloody Stool, Change in Bowel Habits, Chronic diarrhea, Constipation, Difficulty Swallowing, Excessive gas, Gets full quickly at meals, Hemorrhoids, Indigestion, Nausea, Rectal Pain and Vomiting. Female Genitourinary Not Present- Frequency, Nocturia, Painful Urination, Pelvic Pain and Urgency. Musculoskeletal Not Present- Back Pain, Joint Pain, Joint Stiffness, Muscle Pain, Muscle Weakness and Swelling of Extremities. Neurological Not Present- Decreased Memory, Fainting, Headaches, Numbness, Seizures, Tingling, Tremor, Trouble walking and Weakness. Psychiatric Not Present- Anxiety, Bipolar, Change in Sleep Pattern, Depression, Fearful and Frequent crying. Endocrine Not Present- Cold Intolerance, Excessive Hunger, Hair Changes, Heat Intolerance, Hot flashes and New Diabetes. Hematology Not Present- Blood Thinners, Easy Bruising, Excessive bleeding, Gland problems, HIV and Persistent Infections.  Vitals Bary Castilla Bradford CMA; 07/08/2016 3:22 PM) 07/08/2016 3:21 PM Weight: 189.2 lb Height: 67in Body Surface Area: 1.97 m Body Mass Index: 29.63 kg/m  Temp.: 98.26F  Pulse: 60 (Regular)  BP: 124/84 (Sitting, Left Arm, Standard)      Physical Exam Rodman Key K. Casanova Schurman MD;  07/08/2016 4:10 PM)  The physical exam findings are as follows: Note:WDWN in NAD Eyes: Pupils equal, round; sclera anicteric HENT: Oral mucosa moist; good dentition Neck: No masses palpated, no thyromegaly Lungs: CTA bilaterally; normal respiratory effort Breasts: symmetric; healed incision in right lower outer quadrant No axillary lymphadenopathy No palpable masses in either breast No nipple retraction or discharge CV: Regular rate and rhythm; no murmurs; extremities well-perfused with no edema Abd: +bowel sounds, soft, non-tender, no palpable organomegaly; no palpable hernias Skin: Warm, dry; no sign of jaundice Psychiatric - alert and oriented x 4; calm mood and affect    Assessment & Plan Rodman Key K. Kerline Trahan MD; 07/08/2016 4:11 PM)  LEFT BREAST MASS (N63.20) Impression: Mucocele like lesion with calcifications  Current Plans Schedule for Surgery - Left radioactive seed localized lumpectomy. The surgical procedure has been discussed with the patient. Potential risks, benefits, alternative treatments, and expected outcomes have been explained. All of the patient's questions at this time have been answered. The likelihood of reaching the patient's treatment goal is good. The patient understand the proposed surgical procedure and wishes to proceed.

## 2016-07-22 NOTE — Anesthesia Postprocedure Evaluation (Signed)
Anesthesia Post Note  Patient: Tina [name]  Procedure(s) Performed: Procedure(s) (LRB): LEFT BREAST LUMPECTOMY WITH RADIOACTIVE SEED LOCALIZATION (Left)  Patient location during evaluation: PACU Anesthesia Type: General Level of consciousness: awake and alert Pain management: pain level controlled Vital Signs Assessment: post-procedure vital signs reviewed and stable Respiratory status: spontaneous breathing, nonlabored ventilation and respiratory function stable Cardiovascular status: blood pressure returned to baseline and stable Postop Assessment: no signs of nausea or vomiting Anesthetic complications: no       Last Vitals:  Vitals:   07/22/16 0850 07/22/16 0905  BP: (!) 155/94 (!) 157/85  Pulse: 71 66  Resp: 18 15  Temp:  36.6 C    Last Pain:  Vitals:   07/22/16 0543  TempSrc: Oral                 Taegan Standage,W. EDMOND

## 2016-07-22 NOTE — Anesthesia Procedure Notes (Signed)
Procedure Name: LMA Insertion Date/Time: 07/22/2016 7:21 AM Performed by: Clearnce Sorrel Pre-anesthesia Checklist: Patient identified, Emergency Drugs available, Suction available, Patient being monitored and Timeout performed Patient Re-evaluated:Patient Re-evaluated prior to inductionOxygen Delivery Method: Circle system utilized Preoxygenation: Pre-oxygenation with 100% oxygen Intubation Type: IV induction Ventilation: Mask ventilation without difficulty LMA: LMA inserted LMA Size: 4.0 Number of attempts: 1 Airway Equipment and Method: Patient positioned with wedge pillow Tube secured with: Tape Dental Injury: Teeth and Oropharynx as per pre-operative assessment

## 2016-07-23 ENCOUNTER — Encounter (HOSPITAL_COMMUNITY): Payer: Self-pay | Admitting: Surgery

## 2016-08-29 DIAGNOSIS — Z23 Encounter for immunization: Secondary | ICD-10-CM | POA: Diagnosis not present

## 2016-09-03 DIAGNOSIS — H04123 Dry eye syndrome of bilateral lacrimal glands: Secondary | ICD-10-CM | POA: Diagnosis not present

## 2016-09-03 DIAGNOSIS — H1712 Central corneal opacity, left eye: Secondary | ICD-10-CM | POA: Diagnosis not present

## 2016-09-03 DIAGNOSIS — H40023 Open angle with borderline findings, high risk, bilateral: Secondary | ICD-10-CM | POA: Diagnosis not present

## 2016-12-16 ENCOUNTER — Emergency Department (HOSPITAL_COMMUNITY)
Admission: EM | Admit: 2016-12-16 | Discharge: 2016-12-17 | Disposition: A | Discharge status: Home / Self Care | Payer: 59 | Attending: Emergency Medicine | Admitting: Emergency Medicine

## 2016-12-16 ENCOUNTER — Emergency Department (HOSPITAL_COMMUNITY): Payer: 59

## 2016-12-16 ENCOUNTER — Encounter (HOSPITAL_COMMUNITY): Payer: Self-pay | Admitting: Family Medicine

## 2016-12-16 DIAGNOSIS — R0789 Other chest pain: Secondary | ICD-10-CM | POA: Diagnosis not present

## 2016-12-16 DIAGNOSIS — Z5321 Procedure and treatment not carried out due to patient leaving prior to being seen by health care provider: Secondary | ICD-10-CM | POA: Insufficient documentation

## 2016-12-16 DIAGNOSIS — R079 Chest pain, unspecified: Secondary | ICD-10-CM | POA: Diagnosis not present

## 2016-12-16 LAB — CBC
HCT: 38.7 % (ref 36.0–46.0)
Hemoglobin: 13.5 g/dL (ref 12.0–15.0)
MCH: 30.5 pg (ref 26.0–34.0)
MCHC: 34.9 g/dL (ref 30.0–36.0)
MCV: 87.6 fL (ref 78.0–100.0)
Platelets: 214 10*3/uL (ref 150–400)
RBC: 4.42 MIL/uL (ref 3.87–5.11)
RDW: 13.3 % (ref 11.5–15.5)
WBC: 6.5 10*3/uL (ref 4.0–10.5)

## 2016-12-16 LAB — BASIC METABOLIC PANEL
Anion gap: 9 (ref 5–15)
BUN: 19 mg/dL (ref 6–20)
CO2: 25 mmol/L (ref 22–32)
Calcium: 9.7 mg/dL (ref 8.9–10.3)
Chloride: 107 mmol/L (ref 101–111)
Creatinine, Ser: 0.89 mg/dL (ref 0.44–1.00)
GFR calc Af Amer: >60 mL/min (ref >60)
GFR calc non Af Amer: >60 mL/min (ref >60)
Glucose, Bld: 152 mg/dL — ABNORMAL HIGH (ref 65–99)
Potassium: 3.3 mmol/L — ABNORMAL LOW (ref 3.5–5.1)
Sodium: 141 mmol/L (ref 135–145)

## 2016-12-16 LAB — POCT I-STAT TROPONIN I: Troponin i, poc: 0.01 ng/mL (ref 0.00–0.08)

## 2016-12-16 NOTE — ED Triage Notes (Signed)
Patient reports three days ago she developed left shoulder pain. Since, the pain has traveled to left sided chest pain that radiates to left arm. Also, reports having intermittent left arm numbness and tingling. Patient appears in no acute distress with respirations being regular, unlabored, and even.

## 2016-12-18 ENCOUNTER — Telehealth: Payer: Self-pay

## 2016-12-18 DIAGNOSIS — E781 Pure hyperglyceridemia: Secondary | ICD-10-CM | POA: Diagnosis not present

## 2016-12-18 DIAGNOSIS — R0789 Other chest pain: Secondary | ICD-10-CM | POA: Diagnosis not present

## 2016-12-18 DIAGNOSIS — I10 Essential (primary) hypertension: Secondary | ICD-10-CM | POA: Diagnosis not present

## 2016-12-18 NOTE — Telephone Encounter (Signed)
SENT NOTES TO SCHEDULING 

## 2016-12-19 ENCOUNTER — Telehealth (HOSPITAL_COMMUNITY): Payer: Self-pay | Admitting: *Deleted

## 2016-12-19 NOTE — Telephone Encounter (Signed)
Left message on voicemail in reference to upcoming appointment scheduled for 12/22/16. Phone number given for a call back so details instructions can be given. Tina [name]

## 2016-12-20 DIAGNOSIS — Z23 Encounter for immunization: Secondary | ICD-10-CM | POA: Diagnosis not present

## 2016-12-22 ENCOUNTER — Encounter: Payer: 59 | Admitting: Gynecology

## 2016-12-22 ENCOUNTER — Other Ambulatory Visit: Payer: Self-pay | Admitting: Internal Medicine

## 2016-12-22 ENCOUNTER — Ambulatory Visit (HOSPITAL_COMMUNITY): Payer: 59 | Attending: Cardiovascular Disease

## 2016-12-22 DIAGNOSIS — I10 Essential (primary) hypertension: Secondary | ICD-10-CM | POA: Insufficient documentation

## 2016-12-22 DIAGNOSIS — R0789 Other chest pain: Secondary | ICD-10-CM | POA: Diagnosis not present

## 2016-12-22 LAB — MYOCARDIAL PERFUSION IMAGING
Estimated workload: 8.5 METS
Exercise duration (min): 7 min
Exercise duration (sec): 0 s
LV dias vol: 63 mL (ref 46–106)
LV sys vol: 12 mL
MPHR: 158 {beats}/min
Peak HR: 136 {beats}/min
Percent HR: 86 %
RATE: 0.27
Rest HR: 72 {beats}/min
SDS: 4
SRS: 6
SSS: 10
TID: 0.99

## 2016-12-22 MED ORDER — TECHNETIUM TC 99M TETROFOSMIN IV KIT
32.5000 | PACK | Freq: Once | INTRAVENOUS | Status: AC | PRN
  Administered 2016-12-22: 32.5 via INTRAVENOUS
  Filled 2016-12-22: qty 33

## 2016-12-22 MED ORDER — TECHNETIUM TC 99M TETROFOSMIN IV KIT
10.7000 | PACK | Freq: Once | INTRAVENOUS | Status: AC | PRN
  Administered 2016-12-22: 10.7 via INTRAVENOUS
  Filled 2016-12-22: qty 11

## 2016-12-23 ENCOUNTER — Encounter: Payer: Self-pay | Admitting: Gynecology

## 2016-12-23 ENCOUNTER — Ambulatory Visit (INDEPENDENT_AMBULATORY_CARE_PROVIDER_SITE_OTHER): Payer: 59 | Admitting: Gynecology

## 2016-12-23 VITALS — BP 120/78 | Ht 67.5 in | Wt 193.0 lb

## 2016-12-23 DIAGNOSIS — Z01411 Encounter for gynecological examination (general) (routine) with abnormal findings: Secondary | ICD-10-CM

## 2016-12-23 DIAGNOSIS — N9089 Other specified noninflammatory disorders of vulva and perineum: Secondary | ICD-10-CM | POA: Diagnosis not present

## 2016-12-23 DIAGNOSIS — N952 Postmenopausal atrophic vaginitis: Secondary | ICD-10-CM | POA: Diagnosis not present

## 2016-12-23 NOTE — Patient Instructions (Signed)
Follow up for vulvar biopsy as scheduled 

## 2016-12-23 NOTE — Progress Notes (Signed)
    Tina [name] 1956-05-06 [id number]        60 y.o.  G2P2002 for annual exam.  Has noticed a lesion on her left lower full physician wants me to take a look at. Was seen earlier this past year with vulvitis and treated with clobetasol with excellent results. Only needs to use it occasionally to control symptoms.  Past medical history,surgical history, problem list, medications, allergies, family history and social history were all reviewed and documented as reviewed in the EPIC chart.  ROS:  Performed with pertinent positives and negatives included in the history, assessment and plan.   Additional significant findings :  None   Exam: [name] assistant Vitals:   12/23/16 1131  BP: 120/78  Weight: 193 lb (87.5 kg)  Height: 5' 7.5" (1.715 m)   Body mass index is 29.78 kg/m.  General appearance:  Normal affect, orientation and appearance. Skin: Grossly normal HEENT: Without gross lesions.  No cervical or supraclavicular adenopathy. Thyroid normal.  Lungs:  Clear without wheezing, rales or rhonchi Cardiac: RR, without RMG Abdominal:  Soft, nontender, without masses, guarding, rebound, organomegaly or hernia Breasts:  Examined lying and sitting without masses, retractions, discharge or axillary adenopathy.  Bilateral breast scarring noted. Pelvic:  Ext, BUS, Vagina: With atrophic changes.  Small raised slightly pigmented lesion left lower labia majora. Slightly larger fleshy colored lesion upper inner left thigh near crease with groin  Cervix: With atrophic changes  Uterus: Anteverted, normal size, shape and contour, midline and mobile nontender   Adnexa: Without masses or tenderness    Anus and perineum: Normal   Rectovaginal: Normal sphincter tone without palpated masses or tenderness.    Assessment/Plan:  61 y.o. [id number] female for annual exam.  1. Vulvar lesion noted by patient. Slightly pigmented and raised left vulva. Benign fleshy appearing area left upper thigh.  Recommended patient make appointment to have both excised and she agrees to do so. Differential reviewed with patient. 2. History of right breast cancer. Recently had left excisional biopsy showing fibrocystic changes. Exam today NED. Mammography 06/2016. Continue with annual mammography when due. 3. DEXA 2016 normal. Recommend follow up DEXA at age 23. 45. Pap smear/HPV 2015. No Pap smear done today. No history of abnormal Pap smears. Plan repeat Pap smear at 5 year interval per current screening guidelines. 5. Colonoscopy 2016. Repeat at their recommended interval. 6. Health maintenance. No routine lab work done as patient does this elsewhere. Follow up 1 year, sooner as needed.     Anastasio Auerbach MD, 12:05 PM 12/23/2016

## 2017-01-06 ENCOUNTER — Ambulatory Visit: Payer: 59 | Admitting: Gynecology

## 2017-01-07 ENCOUNTER — Encounter: Payer: Self-pay | Admitting: Gynecology

## 2017-01-07 ENCOUNTER — Ambulatory Visit (INDEPENDENT_AMBULATORY_CARE_PROVIDER_SITE_OTHER): Payer: 59 | Admitting: Gynecology

## 2017-01-07 VITALS — BP 118/76

## 2017-01-07 DIAGNOSIS — N9089 Other specified noninflammatory disorders of vulva and perineum: Secondary | ICD-10-CM

## 2017-01-07 NOTE — Patient Instructions (Signed)
Office will call you with biopsy results 

## 2017-01-07 NOTE — Progress Notes (Signed)
    Tina [name] Dec 24, 1955 [id number]        61 y.o.  [name] presents for for for biopsy. Had small pigmented area left lower labia majora noted previously and she wants this excised. Also has a skin tag appearing lesion left inner thigh that she wants excised.  Past medical history,surgical history, problem list, medications, allergies, family history and social history were all reviewed and documented in the EPIC chart.  Directed ROS with pertinent positives and negatives documented in the history of present illness/assessment and plan.  Exam: Tina [name] assistant Vitals:   01/07/17 1040  BP: 118/76   General appearance:  Normal External BUS vagina with small pigmented area left lower labia majora. Pedunculated fibroepithelial polyp appearing area left upper inner thigh. Physical Exam  Genitourinary:     Procedure: Both areas were cleansed with Betadine solution and subsequently infiltrated with 1% lidocaine. Both areas were excised in their entirety and sent to pathology separately. Silver nitrate hemostasis applied. Sterile Band-Aids applied.   Assessment/Plan:  61 y.o. [name] with 2 areas as described above excised. Patient will follow up for biopsy results. Will follow up if any issues with the healing.    Tina Auerbach MD, 11:03 AM 01/07/2017

## 2017-01-09 LAB — PATHOLOGY

## 2017-03-12 DIAGNOSIS — Z0000 Encounter for general adult medical examination without abnormal findings: Secondary | ICD-10-CM | POA: Diagnosis not present

## 2017-03-18 DIAGNOSIS — Z1212 Encounter for screening for malignant neoplasm of rectum: Secondary | ICD-10-CM | POA: Diagnosis not present

## 2017-03-19 DIAGNOSIS — Z23 Encounter for immunization: Secondary | ICD-10-CM | POA: Diagnosis not present

## 2017-03-19 DIAGNOSIS — E781 Pure hyperglyceridemia: Secondary | ICD-10-CM | POA: Diagnosis not present

## 2017-03-19 DIAGNOSIS — D72829 Elevated white blood cell count, unspecified: Secondary | ICD-10-CM | POA: Diagnosis not present

## 2017-03-19 DIAGNOSIS — I10 Essential (primary) hypertension: Secondary | ICD-10-CM | POA: Diagnosis not present

## 2017-03-19 DIAGNOSIS — Z0000 Encounter for general adult medical examination without abnormal findings: Secondary | ICD-10-CM | POA: Diagnosis not present

## 2017-03-19 DIAGNOSIS — R7301 Impaired fasting glucose: Secondary | ICD-10-CM | POA: Diagnosis not present

## 2017-06-23 DIAGNOSIS — M545 Low back pain: Secondary | ICD-10-CM | POA: Diagnosis not present

## 2017-06-23 DIAGNOSIS — M25552 Pain in left hip: Secondary | ICD-10-CM | POA: Diagnosis not present

## 2017-07-06 ENCOUNTER — Other Ambulatory Visit: Payer: Self-pay | Admitting: Gynecology

## 2017-07-06 DIAGNOSIS — Z139 Encounter for screening, unspecified: Secondary | ICD-10-CM

## 2017-07-07 ENCOUNTER — Ambulatory Visit
Admission: RE | Admit: 2017-07-07 | Discharge: 2017-07-07 | Disposition: A | Discharge status: Home / Self Care | Payer: 59 | Source: Ambulatory Visit | Attending: Gynecology | Admitting: Gynecology

## 2017-07-07 DIAGNOSIS — Z1231 Encounter for screening mammogram for malignant neoplasm of breast: Secondary | ICD-10-CM | POA: Diagnosis not present

## 2017-07-07 DIAGNOSIS — Z139 Encounter for screening, unspecified: Secondary | ICD-10-CM

## 2017-09-15 DIAGNOSIS — H1712 Central corneal opacity, left eye: Secondary | ICD-10-CM | POA: Diagnosis not present

## 2017-09-15 DIAGNOSIS — H40023 Open angle with borderline findings, high risk, bilateral: Secondary | ICD-10-CM | POA: Diagnosis not present

## 2017-09-15 DIAGNOSIS — H04123 Dry eye syndrome of bilateral lacrimal glands: Secondary | ICD-10-CM | POA: Diagnosis not present

## 2017-11-19 DIAGNOSIS — D225 Melanocytic nevi of trunk: Secondary | ICD-10-CM | POA: Diagnosis not present

## 2017-11-19 DIAGNOSIS — L814 Other melanin hyperpigmentation: Secondary | ICD-10-CM | POA: Diagnosis not present

## 2017-11-19 DIAGNOSIS — L718 Other rosacea: Secondary | ICD-10-CM | POA: Diagnosis not present

## 2017-12-29 ENCOUNTER — Encounter: Payer: Self-pay | Admitting: Gynecology

## 2017-12-29 ENCOUNTER — Ambulatory Visit (INDEPENDENT_AMBULATORY_CARE_PROVIDER_SITE_OTHER): Payer: 59 | Admitting: Gynecology

## 2017-12-29 VITALS — BP 134/84 | Ht 67.0 in | Wt 192.0 lb

## 2017-12-29 DIAGNOSIS — N952 Postmenopausal atrophic vaginitis: Secondary | ICD-10-CM

## 2017-12-29 DIAGNOSIS — Z1151 Encounter for screening for human papillomavirus (HPV): Secondary | ICD-10-CM

## 2017-12-29 DIAGNOSIS — Z853 Personal history of malignant neoplasm of breast: Secondary | ICD-10-CM

## 2017-12-29 DIAGNOSIS — Z01419 Encounter for gynecological examination (general) (routine) without abnormal findings: Secondary | ICD-10-CM | POA: Diagnosis not present

## 2017-12-29 NOTE — Patient Instructions (Signed)
Follow-up in 1 year, sooner as needed. 

## 2017-12-29 NOTE — Addendum Note (Signed)
Addended by: Nelva Nay on: 12/29/2017 03:20 PM   Modules accepted: Orders

## 2017-12-29 NOTE — Progress Notes (Signed)
    Tina [name] 1955/08/18 [id number]        62 y.o.  G2P2002 for annual gynecologic exam.  Doing well without gynecologic complaints  Past medical history,surgical history, problem list, medications, allergies, family history and social history were all reviewed and documented as reviewed in the EPIC chart.  ROS:  Performed with pertinent positives and negatives included in the history, assessment and plan.   Additional significant findings : None   Exam: [name] assistant Vitals:   12/29/17 1416  BP: 134/84  Weight: 192 lb (87.1 kg)  Height: 5\' 7"  (1.702 m)   Body mass index is 30.07 kg/m.  General appearance:  Normal affect, orientation and appearance. Skin: Grossly normal HEENT: Without gross lesions.  No cervical or supraclavicular adenopathy. Thyroid normal.  Lungs:  Clear without wheezing, rales or rhonchi Cardiac: RR, without RMG Abdominal:  Soft, nontender, without masses, guarding, rebound, organomegaly or hernia Breasts:  Examined lying and sitting without masses, retractions, discharge or axillary adenopathy. Pelvic:  Ext, BUS, Vagina: With atrophic changes  Cervix: With atrophic changes.  Pap smear/HPV done.  Uterus: Anteverted, normal size, shape and contour, midline and mobile nontender   Adnexa: Without masses or tenderness    Anus and perineum: Normal   Rectovaginal: Normal sphincter tone without palpated masses or tenderness.    Assessment/Plan:  62 y.o. G75P2002 female for annual gynecologic exam.   1. Postmenopausal/atrophic genital changes.  No significant menopausal symptoms or any vaginal bleeding. 2. History of right-sided breast cancer.  Mammography 06/2017.  Exam NED. 3. Pap smear/HPV 08/2013.  Pap smear/HPV today.  No history of significant abnormal Pap smears previously. 4. Colonoscopy 2016.  Repeat at their recommended interval. 5. DEXA 2016 normal.  Plan repeat DEXA at age 73. 6. Health maintenance.  No routine lab work done as patient  does this elsewhere.  Follow-up 1 year, sooner as needed.   Anastasio Auerbach MD, 2:55 PM 12/29/2017

## 2017-12-30 LAB — PAP IG AND HPV HIGH-RISK: HPV DNA High Risk: NOT DETECTED

## 2018-03-23 DIAGNOSIS — R7301 Impaired fasting glucose: Secondary | ICD-10-CM | POA: Diagnosis not present

## 2018-03-23 DIAGNOSIS — R82998 Other abnormal findings in urine: Secondary | ICD-10-CM | POA: Diagnosis not present

## 2018-03-23 DIAGNOSIS — Z0000 Encounter for general adult medical examination without abnormal findings: Secondary | ICD-10-CM | POA: Diagnosis not present

## 2018-03-30 DIAGNOSIS — R7301 Impaired fasting glucose: Secondary | ICD-10-CM | POA: Diagnosis not present

## 2018-03-30 DIAGNOSIS — I10 Essential (primary) hypertension: Secondary | ICD-10-CM | POA: Diagnosis not present

## 2018-03-30 DIAGNOSIS — E781 Pure hyperglyceridemia: Secondary | ICD-10-CM | POA: Diagnosis not present

## 2018-03-30 DIAGNOSIS — Z23 Encounter for immunization: Secondary | ICD-10-CM | POA: Diagnosis not present

## 2018-03-30 DIAGNOSIS — Z0000 Encounter for general adult medical examination without abnormal findings: Secondary | ICD-10-CM | POA: Diagnosis not present

## 2018-03-30 DIAGNOSIS — Z1389 Encounter for screening for other disorder: Secondary | ICD-10-CM | POA: Diagnosis not present

## 2018-03-31 DIAGNOSIS — Z1212 Encounter for screening for malignant neoplasm of rectum: Secondary | ICD-10-CM | POA: Diagnosis not present

## 2018-04-01 ENCOUNTER — Other Ambulatory Visit: Payer: Self-pay | Admitting: Internal Medicine

## 2018-04-01 DIAGNOSIS — Z8249 Family history of ischemic heart disease and other diseases of the circulatory system: Secondary | ICD-10-CM

## 2018-04-01 DIAGNOSIS — E781 Pure hyperglyceridemia: Secondary | ICD-10-CM

## 2018-04-07 ENCOUNTER — Ambulatory Visit
Admission: RE | Admit: 2018-04-07 | Discharge: 2018-04-07 | Disposition: A | Discharge status: Home / Self Care | Payer: No Typology Code available for payment source | Source: Ambulatory Visit | Attending: Internal Medicine | Admitting: Internal Medicine

## 2018-04-07 DIAGNOSIS — Z8249 Family history of ischemic heart disease and other diseases of the circulatory system: Secondary | ICD-10-CM

## 2018-04-07 DIAGNOSIS — E781 Pure hyperglyceridemia: Secondary | ICD-10-CM

## 2018-05-18 ENCOUNTER — Other Ambulatory Visit: Payer: Self-pay | Admitting: Gynecology

## 2018-05-18 ENCOUNTER — Encounter: Payer: Self-pay | Admitting: Gynecology

## 2018-05-18 DIAGNOSIS — Z1231 Encounter for screening mammogram for malignant neoplasm of breast: Secondary | ICD-10-CM

## 2018-05-18 DIAGNOSIS — Z853 Personal history of malignant neoplasm of breast: Secondary | ICD-10-CM

## 2018-05-18 NOTE — Telephone Encounter (Signed)
Okay to schedule MRI.

## 2018-05-18 NOTE — Telephone Encounter (Signed)
Okay to schedule MRI as requested

## 2018-07-13 ENCOUNTER — Ambulatory Visit
Admission: RE | Admit: 2018-07-13 | Discharge: 2018-07-13 | Disposition: A | Discharge status: Home / Self Care | Payer: 59 | Source: Ambulatory Visit | Attending: Gynecology | Admitting: Gynecology

## 2018-07-13 DIAGNOSIS — Z1231 Encounter for screening mammogram for malignant neoplasm of breast: Secondary | ICD-10-CM

## 2018-07-22 ENCOUNTER — Telehealth: Payer: Self-pay | Admitting: *Deleted

## 2018-07-22 NOTE — Telephone Encounter (Signed)
Patient is schedule on 07/27/18 for bilateral Mri breast w/wo contrast, prior approval done via [place] [employer] which was denied, peer to peer option sent to Dr. Phineas Real with medical doctor information.

## 2018-07-27 ENCOUNTER — Other Ambulatory Visit: Payer: 59

## 2018-07-28 ENCOUNTER — Encounter: Payer: Self-pay | Admitting: *Deleted

## 2018-08-03 ENCOUNTER — Encounter: Payer: Self-pay | Admitting: *Deleted

## 2018-08-05 NOTE — Telephone Encounter (Signed)
Per Dr.Fontaine "I do not think I would be the person to argue with the insurance company as I was just continuing the recommendation by another physician. Our records do not go back to when she saw Dr. Beryle Beams and I would not be able to produce that documentation that it was recommended by the oncologist. The insurance company will not accept I am ordering this because another doctor recommended it. I think either her general surgeon who sees her for her breasts now ie Dr Georgette Dover who did her last lumpectomy or an oncologist reviewing her situation would better articulate a recommendation that would be approved by the insurance company if they feel ongoing MRIs are needed."     My chart message sent to patient informing her of the above.

## 2018-11-05 ENCOUNTER — Other Ambulatory Visit: Payer: Self-pay | Admitting: Surgery

## 2018-11-05 DIAGNOSIS — Z1231 Encounter for screening mammogram for malignant neoplasm of breast: Secondary | ICD-10-CM

## 2018-11-30 ENCOUNTER — Other Ambulatory Visit: Payer: 59

## 2018-12-15 ENCOUNTER — Other Ambulatory Visit: Payer: Self-pay | Admitting: Internal Medicine

## 2018-12-15 DIAGNOSIS — R1012 Left upper quadrant pain: Secondary | ICD-10-CM

## 2018-12-17 ENCOUNTER — Other Ambulatory Visit: Payer: Self-pay | Admitting: Internal Medicine

## 2019-01-06 ENCOUNTER — Ambulatory Visit (INDEPENDENT_AMBULATORY_CARE_PROVIDER_SITE_OTHER): Payer: 59 | Admitting: Gynecology

## 2019-01-06 ENCOUNTER — Other Ambulatory Visit: Payer: Self-pay

## 2019-01-06 ENCOUNTER — Encounter: Payer: Self-pay | Admitting: Gynecology

## 2019-01-06 VITALS — BP 118/76 | Ht 67.5 in | Wt 185.0 lb

## 2019-01-06 DIAGNOSIS — N952 Postmenopausal atrophic vaginitis: Secondary | ICD-10-CM | POA: Diagnosis not present

## 2019-01-06 DIAGNOSIS — Z01419 Encounter for gynecological examination (general) (routine) without abnormal findings: Secondary | ICD-10-CM | POA: Diagnosis not present

## 2019-01-06 DIAGNOSIS — Z853 Personal history of malignant neoplasm of breast: Secondary | ICD-10-CM | POA: Diagnosis not present

## 2019-01-06 NOTE — Patient Instructions (Signed)
Follow-up in 1 year for annual exam 

## 2019-01-06 NOTE — Progress Notes (Signed)
    Ryeleigh Kimes 01-Apr-1956 [id number]        63 y.o.  [id number] for annual gynecologic exam.  Without gynecologic complaints  Past medical history,surgical history, problem list, medications, allergies, family history and social history were all reviewed and documented as reviewed in the EPIC chart.  ROS:  Performed with pertinent positives and negatives included in the history, assessment and plan.   Additional significant findings : None   Exam: [name] assistant Vitals:   01/06/19 0852  BP: 118/76  Weight: 185 lb (83.9 kg)  Height: 5' 7.5" (1.715 m)   Body mass index is 28.55 kg/m.  General appearance:  Normal affect, orientation and appearance. Skin: Grossly normal HEENT: Without gross lesions.  No cervical or supraclavicular adenopathy. Thyroid normal.  Lungs:  Clear without wheezing, rales or rhonchi Cardiac: RR, without RMG Abdominal:  Soft, nontender, without masses, guarding, rebound, organomegaly or hernia Breasts:  Examined lying and sitting without masses, retractions, discharge or axillary adenopathy. Pelvic:  Ext, BUS, Vagina: With atrophic changes  Cervix: With atrophic changes  Uterus: Anteverted, normal size, shape and contour, midline and mobile nontender   Adnexa: Without masses or tenderness    Anus and perineum: Normal   Rectovaginal: Normal sphincter tone without palpated masses or tenderness.    Assessment/Plan:  63 y.o. G59P2002 female for annual gynecologic exam.   1. Postmenopausal.  Without significant menopausal symptoms or any vaginal bleeding. 2. History of right-sided breast cancer.  Exam NED.  Mammography 07/2018.  Has screening MRI scheduled. 3. Pap smear/HPV 2019.  No Pap smear done today.  No history of abnormal Pap smears previously. 4. DEXA 2016 normal.  Plan repeat DEXA next year at age to 72. 102. Colonoscopy 2016.  Repeat at their recommended interval. 6. Health maintenance.  No routine lab work done as patient does this  elsewhere.  Follow-up 1 year, sooner as needed.   Anastasio Auerbach MD, 9:18 AM 01/06/2019

## 2019-01-11 ENCOUNTER — Ambulatory Visit
Admission: RE | Admit: 2019-01-11 | Discharge: 2019-01-11 | Disposition: A | Discharge status: Home / Self Care | Payer: Medicare Other | Source: Ambulatory Visit | Attending: Surgery | Admitting: Surgery

## 2019-01-11 ENCOUNTER — Other Ambulatory Visit: Payer: Self-pay

## 2019-01-11 DIAGNOSIS — Z1231 Encounter for screening mammogram for malignant neoplasm of breast: Secondary | ICD-10-CM

## 2019-01-11 MED ORDER — GADOBUTROL 1 MMOL/ML IV SOLN
8.0000 mL | Freq: Once | INTRAVENOUS | Status: AC | PRN
  Administered 2019-01-11: 8 mL via INTRAVENOUS

## 2019-02-08 ENCOUNTER — Encounter: Payer: Self-pay | Admitting: Gynecology

## 2019-06-02 ENCOUNTER — Other Ambulatory Visit: Payer: Self-pay | Admitting: Surgery

## 2019-06-02 DIAGNOSIS — Z1231 Encounter for screening mammogram for malignant neoplasm of breast: Secondary | ICD-10-CM

## 2019-07-14 ENCOUNTER — Ambulatory Visit
Admission: RE | Admit: 2019-07-14 | Discharge: 2019-07-14 | Disposition: A | Discharge status: Home / Self Care | Payer: Medicare Other | Source: Ambulatory Visit | Attending: Surgery | Admitting: Surgery

## 2019-07-14 ENCOUNTER — Other Ambulatory Visit: Payer: Self-pay

## 2019-07-14 DIAGNOSIS — Z1231 Encounter for screening mammogram for malignant neoplasm of breast: Secondary | ICD-10-CM

## 2019-12-06 ENCOUNTER — Encounter (INDEPENDENT_AMBULATORY_CARE_PROVIDER_SITE_OTHER): Payer: Medicare Other

## 2019-12-06 ENCOUNTER — Other Ambulatory Visit: Payer: Self-pay

## 2019-12-06 ENCOUNTER — Other Ambulatory Visit: Payer: Self-pay | Admitting: Obstetrics & Gynecology

## 2019-12-06 DIAGNOSIS — Z13820 Encounter for screening for osteoporosis: Secondary | ICD-10-CM | POA: Diagnosis not present

## 2019-12-06 DIAGNOSIS — Z780 Asymptomatic menopausal state: Secondary | ICD-10-CM | POA: Diagnosis not present

## 2019-12-27 ENCOUNTER — Other Ambulatory Visit: Payer: Self-pay | Admitting: Surgery

## 2019-12-27 DIAGNOSIS — Z1239 Encounter for other screening for malignant neoplasm of breast: Secondary | ICD-10-CM

## 2020-01-10 ENCOUNTER — Other Ambulatory Visit: Payer: Self-pay

## 2020-01-10 ENCOUNTER — Encounter: Payer: Self-pay | Admitting: Nurse Practitioner

## 2020-01-10 ENCOUNTER — Ambulatory Visit (INDEPENDENT_AMBULATORY_CARE_PROVIDER_SITE_OTHER): Payer: Medicare Other | Admitting: Nurse Practitioner

## 2020-01-10 VITALS — BP 118/80 | Ht 67.0 in | Wt 196.0 lb

## 2020-01-10 DIAGNOSIS — Z01419 Encounter for gynecological examination (general) (routine) without abnormal findings: Secondary | ICD-10-CM

## 2020-01-10 DIAGNOSIS — L900 Lichen sclerosus et atrophicus: Secondary | ICD-10-CM | POA: Diagnosis not present

## 2020-01-10 DIAGNOSIS — Z853 Personal history of malignant neoplasm of breast: Secondary | ICD-10-CM | POA: Diagnosis not present

## 2020-01-10 NOTE — Progress Notes (Signed)
   Tina [name] 1955-11-17 [id number]   History:  64 y.o. [id number] presents for breast and pelvic exam without GYN complaints. Postmenopausal - no HRT, no bleeding. 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 2018 left lumpectomy. Normal pap history. Lichen sclerosis, uses clobetasol as needed, rare flares.   Gynecologic History Patient's last menstrual period was 05/05/2010.   Contraception: post menopausal status Last Pap: 12/30/2017. Results were: normal Last mammogram: 07/14/2019. Results were: normal Last colonoscopy: 01/08/2015. Results were: benign polyp, 5 year follow up recommended due to high risk - family history, polyps Last Dexa: 12/06/2019. Results were: normal, 5 year follow up recommended  Past medical history, past surgical history, family history and social history were all reviewed and documented in the EPIC chart.  ROS:  A ROS was performed and pertinent positives and negatives are included.  Exam:  Vitals:   01/10/20 0857  BP: 118/80  Weight: 196 lb (88.9 kg)  Height: 5\' 7"  (1.702 m)   Body mass index is 30.7 kg/m.  General appearance:  Normal Thyroid:  Symmetrical, normal in size, without palpable masses or nodularity. Respiratory  Auscultation:  Clear without wheezing or rhonchi Cardiovascular  Auscultation:  Regular rate, without rubs, murmurs or gallops  Edema/varicosities:  Not grossly evident Abdominal  Soft,nontender, without masses, guarding or rebound.  Liver/spleen:  No organomegaly noted  Hernia:  None appreciated  Skin  Inspection:  Grossly normal   Breasts: Examined lying and sitting.   Right: Without masses, retractions, discharge or axillary adenopathy. Well-healed lumpectomy scar.  Left: Without masses, retractions, discharge or axillary adenopathy. Gentitourinary   Inguinal/mons:  Normal without inguinal adenopathy  External genitalia:  Thin, pearly appearance consistent with Lichen  Sclerosis  BUS/Urethra/Skene's glands:  Normal  Vagina:  Normal  Cervix:  Normal  Uterus:  Anteverted, normal in size, shape and contour.  Midline and mobile  Adnexa/parametria:     Rt: Without masses or tenderness.   Lt: Without masses or tenderness.  Anus and perineum: Normal  Digital rectal exam: Normal sphincter tone without palpated masses or tenderness  Assessment/Plan:  64 y.o. [id number] for breast and pelvic exam.   Well female exam with routine gynecological exam - Education provided on SBEs, importance of preventative screenings, current guidelines, high calcium diet, regular exercise, and multivitamin daily. Labs with primary care.   History of right breast cancer - lobular neoplasia/atypical ductal hyperplasia. Normal exam today. Up to date on mammogram. Breast MRI every 2 years.   Lichen sclerosus - rare flares, uses clobetasol as needed.  Follow up in 1 year for annual       [place], 9:08 AM 01/10/2020

## 2020-01-10 NOTE — Patient Instructions (Signed)

## 2020-05-12 DIAGNOSIS — C50919 Malignant neoplasm of unspecified site of unspecified female breast: Secondary | ICD-10-CM

## 2020-05-12 HISTORY — DX: Malignant neoplasm of unspecified site of unspecified female breast: C50.919

## 2020-06-19 ENCOUNTER — Other Ambulatory Visit: Payer: Self-pay | Admitting: Surgery

## 2020-06-19 DIAGNOSIS — Z1231 Encounter for screening mammogram for malignant neoplasm of breast: Secondary | ICD-10-CM

## 2020-08-07 ENCOUNTER — Ambulatory Visit
Admission: RE | Admit: 2020-08-07 | Discharge: 2020-08-07 | Disposition: A | Discharge status: Home / Self Care | Payer: Medicare Other | Source: Ambulatory Visit | Attending: Surgery | Admitting: Surgery

## 2020-08-07 ENCOUNTER — Other Ambulatory Visit: Payer: Self-pay

## 2020-08-07 DIAGNOSIS — Z1231 Encounter for screening mammogram for malignant neoplasm of breast: Secondary | ICD-10-CM

## 2020-09-18 ENCOUNTER — Ambulatory Visit (INDEPENDENT_AMBULATORY_CARE_PROVIDER_SITE_OTHER): Payer: Medicare Other | Admitting: Nurse Practitioner

## 2020-09-18 ENCOUNTER — Other Ambulatory Visit: Payer: Self-pay

## 2020-09-18 ENCOUNTER — Encounter: Payer: Self-pay | Admitting: Nurse Practitioner

## 2020-09-18 VITALS — BP 114/70 | HR 86 | Resp 16 | Wt 194.0 lb

## 2020-09-18 DIAGNOSIS — N812 Incomplete uterovaginal prolapse: Secondary | ICD-10-CM | POA: Diagnosis not present

## 2020-09-18 DIAGNOSIS — L439 Lichen planus, unspecified: Secondary | ICD-10-CM | POA: Diagnosis not present

## 2020-09-18 DIAGNOSIS — R102 Pelvic and perineal pain: Secondary | ICD-10-CM

## 2020-09-18 MED ORDER — CLOBETASOL PROPIONATE 0.05 % EX OINT: 1.0000 "application " | TOPICAL_OINTMENT | Freq: Two times a day (BID) | CUTANEOUS | 1 refills | Status: DC

## 2020-09-18 NOTE — Progress Notes (Signed)
   Acute Office Visit  Subjective:    Patient ID: Tina [name], female    DOB: 1955-11-25, 65 y.o.   MRN: [id number]   HPI 65 y.o. presents today for pelvic pressure x 2 weeks. Denies any pain, urinary or vaginal symptoms, or change in bowel movements. The pressure feels like a small bulge in the upper vagina and is noticed more with sitting. History of lichen planus that is managed with occasional use of Clobetasol. Last time she used the ointment it burned and she thinks it may be expired.   Review of Systems  Constitutional: Negative.   Genitourinary: Negative for dysuria, frequency, pelvic pain, urgency, vaginal discharge and vaginal pain.       Pelvic pressure       Objective:    Physical Exam Constitutional:      Appearance: Normal appearance.  Genitourinary:    Vagina: Normal.     Cervix: Normal.     Uterus: With uterine prolapse (mild, stage 1).      Comments: Vulvar redness consistent with lichen planus    BP 417/40   Pulse 86   Resp 16   Wt 194 lb (88 kg)   LMP 05/05/2010   BMI 30.38 kg/m  Wt Readings from Last 3 Encounters:  09/18/20 194 lb (88 kg)  01/10/20 196 lb (88.9 kg)  01/06/19 185 lb (83.9 kg)        Assessment & Plan:   Problem List Items Addressed This Visit   None   Visit Diagnoses    Pelvic pressure in female    -  Primary   Lichen planus       Relevant Medications   clobetasol ointment (TEMOVATE) 0.05 %   First degree uterine prolapse         Plan: Symptoms likely from mild uterine prolapse. We discussed management, prognosis, and treatment. She is aware symptoms may be worse on days she is standing more. She will do Kegel exercises along with pelvic floor strengthening exercises. She has an annual visit in August and we will reassess then. New prescription for Clobetasol sent to pharmacy with instructions to apply twice daily x 7 days and then as needed. She is agreeable to plan.      Tamela Gammon DNP, 10:38 AM  09/18/2020

## 2020-09-19 ENCOUNTER — Encounter: Payer: Self-pay | Admitting: Nurse Practitioner

## 2020-09-19 DIAGNOSIS — R102 Pelvic and perineal pain: Secondary | ICD-10-CM

## 2020-09-19 NOTE — Telephone Encounter (Signed)
Yes, please place order for ultrasound for pelvic pressure. Thank you!

## 2020-09-19 NOTE — Telephone Encounter (Signed)
Order placed will route to appointments to schedule. 

## 2020-09-19 NOTE — Telephone Encounter (Signed)
Okay to place the order?

## 2020-10-30 ENCOUNTER — Other Ambulatory Visit: Payer: Medicare Other | Admitting: Obstetrics & Gynecology

## 2020-10-30 ENCOUNTER — Other Ambulatory Visit: Payer: Medicare Other

## 2020-11-08 ENCOUNTER — Other Ambulatory Visit (HOSPITAL_COMMUNITY)
Admission: RE | Admit: 2020-11-08 | Discharge: 2020-11-08 | Disposition: A | Discharge status: Home / Self Care | Payer: Medicare Other | Source: Ambulatory Visit | Attending: Obstetrics and Gynecology | Admitting: Obstetrics and Gynecology

## 2020-11-08 ENCOUNTER — Other Ambulatory Visit: Payer: Medicare Other

## 2020-11-08 ENCOUNTER — Encounter: Payer: Self-pay | Admitting: Obstetrics and Gynecology

## 2020-11-08 ENCOUNTER — Ambulatory Visit (INDEPENDENT_AMBULATORY_CARE_PROVIDER_SITE_OTHER): Payer: Medicare Other

## 2020-11-08 ENCOUNTER — Ambulatory Visit (INDEPENDENT_AMBULATORY_CARE_PROVIDER_SITE_OTHER): Payer: Medicare Other | Admitting: Obstetrics and Gynecology

## 2020-11-08 ENCOUNTER — Other Ambulatory Visit: Payer: Medicare Other | Admitting: Obstetrics and Gynecology

## 2020-11-08 ENCOUNTER — Other Ambulatory Visit: Payer: Medicare Other | Admitting: Obstetrics & Gynecology

## 2020-11-08 ENCOUNTER — Other Ambulatory Visit: Payer: Self-pay

## 2020-11-08 VITALS — BP 118/82 | HR 70 | Wt 194.0 lb

## 2020-11-08 DIAGNOSIS — R9389 Abnormal findings on diagnostic imaging of other specified body structures: Secondary | ICD-10-CM

## 2020-11-08 DIAGNOSIS — R102 Pelvic and perineal pain: Secondary | ICD-10-CM

## 2020-11-08 DIAGNOSIS — N83201 Unspecified ovarian cyst, right side: Secondary | ICD-10-CM | POA: Diagnosis not present

## 2020-11-08 DIAGNOSIS — D219 Benign neoplasm of connective and other soft tissue, unspecified: Secondary | ICD-10-CM

## 2020-11-08 DIAGNOSIS — N882 Stricture and stenosis of cervix uteri: Secondary | ICD-10-CM

## 2020-11-08 NOTE — Patient Instructions (Signed)
ENDOMETRIAL BIOPSY POST-PROCEDURE INSTRUCTIONS  You may take Ibuprofen, Aleve or Tylenol for pain if needed.  Cramping should resolve within in 24 hours.  You may have a small amount of spotting.  You should wear a mini pad for the next few days.  You may have intercourse after 24 hours.  You need to call if you have any pelvic pain, fever, heavy bleeding or foul smelling vaginal discharge.  Shower or bathe as normal  6. We will call you within one week with results or we will discuss   the results at your follow-up appointment if needed.   Atlas of pelvic anatomy and gynecologic surgery (4th ed., pp. 205-212). Elsevier.">  Dilation and Curettage or Vacuum Curettage Dilation and curettage (D&C) and vacuum curettage are minor procedures. A D&C involves stretching the cervix (dilation) and scraping the inside lining of the uterus, or endometrium, with surgical instruments (curettage). During a D&C, tissue is gently scraped starting from the top of the uterus down to the lowest part of the uterus. During a vacuum curettage, the liningand tissue in the uterus are removed using gentle suction. Curettage may be performed to either diagnose or treat a problem. A diagnostic curettage may be done if you have: Irregular bleeding or clotting from the uterus. Spotting between menstrual periods, prolonged menstrual periods, or other abnormal bleeding. Bleeding after menopause. No menstrual period (amenorrhea). A change in the size and shape of the uterus. Abnormal endometrial cells discovered during a Pap test. For treatment, curettage may be done: To remove an IUD (intrauterine device). To remove the remaining placenta after giving birth. During an abortion or after a miscarriage. To remove growths in the lining of the uterus. To remove certain rare types of noncancerous lumps (fibroids). Tell a health care provider about: Any allergies you have, including allergies to prescribed medicine or  latex. All medicines you are taking, including vitamins, herbs, eye drops, creams, and over-the-counter medicines. Any problems you or family members have had with anesthetic medicines. Any blood disorders you have. Any surgeries you have had. Your medical history and any medical conditions you have. Whether you are pregnant or may be pregnant. Recent vaginal infections you have had. Recent menstrual periods, bleeding problems you have had, and what form of birth control (contraception) you use. What are the risks? Generally, this is a safe procedure. However, problems may occur, including: Infection. Heavy vaginal bleeding. Allergic reactions to medicines. Damage to the cervix or nearby structures or organs. Scar tissue developing inside the uterus. This can cause abnormal periods and may make it harder to get pregnant. A hole (perforation) in the wall of the uterus. This is rare. What happens before the procedure? Staying hydrated Follow instructions from your health care provider about hydration, which may include: Up to 2 hours before the procedure - you may continue to drink clear liquids, such as water, clear fruit juice, black coffee, and plain tea.  Eating and drinking restrictions Follow instructions from your health care provider about eating and drinking, which may include: 8 hours before the procedure - stop eating heavy meals or foods, such as meat, fried foods, or fatty foods. 6 hours before the procedure - stop eating light meals or foods, such as toast or cereal. 6 hours before the procedure - stop drinking milk or drinks that contain milk. 2 hours before the procedure - stop drinking clear liquids. If your health care provider told you to take your medicine on the day of your procedure, take them  with only a sip of water. Medicines Ask your health care provider about: Changing or stopping your regular medicines. This is especially important if you are taking diabetes  medicines or blood thinners. Taking medicines such as aspirin and ibuprofen. These medicines can thin your blood. Do not take these medicines unless your health care provider tells you to take them. Taking over-the-counter medicines, vitamins, herbs, and supplements. You may be given a medicine to soften the cervix. This will help with dilation. Tests You may be given a pregnancy test on the day of the procedure. You may have a blood or urine sample taken. General instructions Do not use any products that contain nicotine or tobacco for at least 4 weeks before the procedure. These products include cigarettes, chewing tobacco, and vaping devices, such as e-cigarettes. If you need help quitting, ask your health care provider. For 24 hours before your procedure: Do not douche, use tampons, or have sex. Do not use medicines, creams, or suppositories in the vagina. Ask your health care provider what steps will be taken to help prevent infection. These may include: Removing hair at the procedure site. Washing skin with a germ-killing soap. Taking antibiotic medicine. Plan to have a responsible adult take you home from the hospital or clinic. If you will be going home right after the procedure, plan to have a responsible adult care for you for the time you are told. This is important. What happens during the procedure?  An IV will be inserted into one of your veins. You will be given one of the following: A medicine that numbs the area in and around the cervix (local anesthetic). A medicine to make you fall asleep (general anesthetic). You will lie down on your back, with your feet in foot rests (stirrups). The size and position of your uterus will be checked. A lubricated instrument (speculum or Sims retractor) will be inserted into your vagina to widen its walls. This will allow your health care provider to see your cervix. Your cervix will be softened and dilated. This may be done by: Taking  medicine by mouth or vaginally. Having thin rods or gradual widening instruments inserted into your cervix. A small, sharp, curved instrument (curette) will be used to scrape a small amount of tissue or cells from the endometrium or cervical canal. In some cases, gentle suction is applied with the curette. The cells will be taken to a lab for testing. The procedure may vary among health care providers and hospitals. What happens after the procedure? Your blood pressure, heart rate, breathing rate, and blood oxygen level will be monitored until you leave the hospital or clinic. You may have mild cramping, a backache, pain, and light bleeding or spotting. You may pass small blood clots from your vagina. You may have to wear compression stockings. These stockings help to prevent blood clots and reduce swelling in your legs. It is up to you to get the results of your procedure. Ask your health care provider, or the department that is doing the procedure, when your results will be ready. Summary Dilation and curettage (D&C) involves stretching (dilating) the cervix and scraping the inside lining of the uterus with surgical instruments (curettage). Follow your health care provider's instructions about when to stop eating and drinking before the procedure, and whether to stop or change any medicines. After the procedure, you may have mild cramping, a backache, pain, and light bleeding or spotting. You may pass small blood clots from your vagina. Plan to  have a responsible adult take you home from the hospital or clinic. This information is not intended to replace advice given to you by your health care provider. Make sure you discuss any questions you have with your healthcare provider. Document Revised: 04/18/2020 Document Reviewed: 04/18/2020 Elsevier Patient Education  2022 [employer]. Hysteroscopy Hysteroscopy is a procedure used to look inside a woman's womb (uterus). This may be done for various  reasons, including: To look for tumors and other growths in the uterus. To evaluate abnormal bleeding, fibroid tumors, polyps, scar tissue, or uterine cancer. To determine why a woman is unable to get pregnant or has had repeated pregnancy losses. To locate an IUD (intrauterine device). To place a birth control device into the fallopian tubes. During this procedure, a thin, flexible tube with a small light and camera (hysteroscope) is used to examine the uterus. The camera sends images to a monitor in the room so that your health care provider can view the inside of your uterus. Ahysteroscopy should be done right after a menstrual period. Tell a health care provider about: Any allergies you have. All medicines you are taking, including vitamins, herbs, eye drops, creams, and over-the-counter medicines. Any problems you or family members have had with anesthetic medicines. Any blood disorders you have. Any surgeries you have had. Any medical conditions you have. Whether you are pregnant or may be pregnant. Whether you have been diagnosed with an STI (sexually transmitted infection) or you think you have an STI. What are the risks? Generally, this is a safe procedure. However, problems may occur, including: Excessive bleeding. Infection. Damage to the uterus or other structures or organs. Allergic reaction to medicines or fluids that are used in the procedure. What happens before the procedure? Staying hydrated Follow instructions from your health care provider about hydration, which may include: Up to 2 hours before the procedure - you may continue to drink clear liquids, such as water, clear fruit juice, black coffee, and plain tea. Eating and drinking restrictions Follow instructions from your health care provider about eating and drinking, which may include: 8 hours before the procedure - stop eating solid foods and drink clear liquids only. 2 hours before the procedure - stop drinking  clear liquids. Medicines Ask your health care provider about: Changing or stopping your regular medicines. This is especially important if you are taking diabetes medicines or blood thinners. Taking medicines such as aspirin and ibuprofen. These medicines can thin your blood. Do not take these medicines unless your health care provider tells you to take them. Taking over-the-counter medicines, vitamins, herbs, and supplements. Medicine may be placed in your cervix the day before the procedure. This medicine causes the cervix to open (dilate). The larger opening makes it easier for the hysteroscope to be inserted into the uterus during the procedure. General instructions Ask your health care provider: What steps will be taken to help prevent infection. These steps may include: Washing skin with a germ-killing soap. Taking antibiotic medicine. Do not use any products that contain nicotine or tobacco for at least 4 weeks before the procedure. These products include cigarettes, chewing tobacco, and vaping devices, such as e-cigarettes. If you need help quitting, ask your health care provider. Plan to have a responsible adult take you home from the hospital or clinic. Plan to have a responsible adult care for you for the time you are told after you leave the hospital or clinic. This is important. Empty your bladder before the procedure begins. What  happens during the procedure? An IV will be inserted into one of your veins. You may be given: A medicine to help you relax (sedative). A medicine that numbs the area around the cervix (local anesthetic). A medicine to make you fall asleep (general anesthetic). A hysteroscope will be inserted through your vagina and into your uterus. Air or fluid will be used to enlarge your uterus to allow your health care provider to see it better. The amount of fluid used will be carefully checked throughout the procedure. In some cases, tissue may be gently scraped  from inside the uterus and sent to a lab for testing (biopsy). The procedure may vary among health care providers and hospitals. What can I expect after the procedure? Your blood pressure, heart rate, breathing rate, and blood oxygen level will be monitored until you leave the hospital or clinic. You may have cramps. You may be given medicines for this. You may have bleeding, which may vary from light spotting to menstrual-like bleeding. This is normal. If you had a biopsy, it is up to you to get the results. Ask your health care provider, or the department that is doing the procedure, when your results will be ready. Follow these instructions at home: Activity Rest as told by your health care provider. Return to your normal activities as told by your health care provider. Ask your health care provider what activities are safe for you. If you were given a sedative during the procedure, it can affect you for several hours. Do not drive or operate machinery until your health care provider says that it is safe. Medicines Do not take aspirin or other NSAIDs during recovery, as told by your healthcare provider. It can increase the risk of bleeding. Ask your health care provider if the medicine prescribed to you: Requires you to avoid driving or using machinery. Can cause constipation. You may need to take these actions to prevent or treat constipation: Drink enough fluid to keep your urine pale yellow. Take over-the-counter or prescription medicines. Eat foods that are high in fiber, such as beans, whole grains, and fresh fruits and vegetables. Limit foods that are high in fat and processed sugars, such as fried or sweet foods. General instructions Do not douche, use tampons, or have sex for 2 weeks after the procedure, or until your health care provider approves. Do not take baths, swim, or use a hot tub until your health care provider approves. Take showers instead of baths for 2 weeks, or for as  long as told by your health care provider. Keep all follow-up visits. This is important. Contact a health care provider if: You feel dizzy or lightheaded. You feel nauseous. You have abnormal vaginal discharge. You have a rash. You have pain that does not get better with medicine. You have chills. Get help right away if: You have bleeding that is heavier than a normal menstrual period. You have a fever. You have pain or cramps that get worse. You develop new abdominal pain. You faint. You have pain in your shoulder. You are short of breath. Summary Hysteroscopy is a procedure that is used to look inside a woman's womb (uterus). After the procedure, you may have bleeding, which varies from light spotting to menstrual-like bleeding. This is normal. You may also have cramps. Do not douche, use tampons, or have sex for 2 weeks after the procedure, or until your health care provider approves. Plan to have a responsible adult take you home from the hospital  or clinic. This information is not intended to replace advice given to you by your health care provider. Make sure you discuss any questions you have with your healthcare provider. Document Revised: 12/14/2019 Document Reviewed: 12/14/2019 Elsevier Patient Education  2022 [employer].

## 2020-11-08 NOTE — Progress Notes (Signed)
GYNECOLOGY  VISIT   HPI: 65 y.o.   Married  Caucasian  female   G2P2002 with Patient's last menstrual period was 05/05/2010.   here for pelvic ultrasound.   Seen by Marny Lowenstein for pelvic pressure on 09/18/20.  Symptoms for three months.  Increased with increased activity.  Patient has first degree uterine prolapse.   Had spotting with urination once 4 months ago.   Mother had endometrial cancer.  Daughter in law delivering in 2 weeks.   GYNECOLOGIC HISTORY: Patient's last menstrual period was 05/05/2010. Contraception:  PMP Menopausal hormone therapy: None Last mammogram: 08-07-20 3D/Neg/Birads1 Last pap smear:  12-29-17 Neg:Neg HR HPV, 08-25-13 Neg:Neg HR HPV         OB History     Gravida  2   Para  2   Term  2   Preterm      AB      Living  2      SAB      IAB      Ectopic      Multiple      Live Births                 Patient Active Problem List   Diagnosis Date Noted   History of right breast cancer 01/10/2020   HTN (hypertension) 02/01/2012   Other and unspecified hyperlipidemia 02/01/2012    Past Medical History:  Diagnosis Date   Acid reflux    Cancer ([place]) 2002   Breast cancer-Lobular carcinoma in situ   Hyperlipidemia    Hypertension    Osteopenia 07/2011   t score -1.1 FRAX 13%/0.4%   Schatzki's ring     Past Surgical History:  Procedure Laterality Date   BREAST BIOPSY Left    Stereo High Risk    BREAST EXCISIONAL BIOPSY Right    BREAST EXCISIONAL BIOPSY Left 2018   BREAST LUMPECTOMY WITH RADIOACTIVE SEED LOCALIZATION Left 07/22/2016   Procedure: LEFT BREAST LUMPECTOMY WITH RADIOACTIVE SEED LOCALIZATION;  Surgeon: Donnie Mesa, MD;  Location: [facility];  Service: General;  Laterality: Left;   BREAST SURGERY  2004   excisional right breast surgery    CHOLECYSTECTOMY  2003    Current Outpatient Medications  Medication Sig Dispense Refill   acetaminophen (TYLENOL) 325 MG tablet Take 650 mg by mouth every 6 (six) hours as  needed (for pain/headaches.).     Calcium Carb-Cholecalciferol 600-800 MG-UNIT TABS Take 1 tablet by mouth at bedtime.     cholecalciferol (VITAMIN D) 1000 units tablet Take 1,000 Units by mouth at bedtime.     clobetasol ointment (TEMOVATE) 4.09 % Apply 1 application topically 2 (two) times daily. 30 g 1   losartan (COZAAR) 100 MG tablet Take 1 tablet by mouth daily.     LOVAZA 1 G capsule Take 2 g by mouth 2 (two) times daily.      MIRVASO 0.33 % GEL Apply 1 application topically daily. Applied to face daily in the morning.     Multiple Vitamin (MULTIVITAMIN) capsule Take 1 capsule by mouth daily.     omeprazole (PRILOSEC) 40 MG capsule Take 40 mg by mouth as needed.     tobramycin (TOBREX) 0.3 % ophthalmic solution Place into the left eye.     No current facility-administered medications for this visit.     ALLERGIES: Dextromethorphan  Family History  Problem Relation Age of Onset   Stroke Father 82   Diabetes Father    Hypertension Father    Heart  disease Father        bypass   Cancer Father        parotid cancer   Cancer Mother 6       uterine   Osteoporosis Mother    Dementia Mother    Diabetes Brother        type 2   Diabetes Son 18       MODY   Asthma Son    Cancer Maternal Uncle        colon   Cancer Paternal Uncle        colon    Social History   Socioeconomic History   Marital status: Married    Spouse name: Not on file   Number of children: 2   Years of education: Not on file   Highest education level: Not on file  Occupational History   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[employer], works PRN  Tobacco Use   Smoking status: Never   Smokeless tobacco: Never  Scientific laboratory technician Use: Never used  Substance and Sexual Activity   Alcohol use: Yes    Comment: Once a month   Drug use: Never   Sexual activity: Yes    Partners: Male    Birth control/protection: Post-menopausal    Comment: 1st intercourse 20 yo-1 partner  Other Topics Concern    Not on file  Social History Narrative   Not on file   Social Determinants of Health   Financial Resource Strain: Not on file  Food Insecurity: Not on file  Transportation Needs: Not on file  Physical Activity: Not on file  Stress: Not on file  Social Connections: Not on file  Intimate Partner Violence: Not on file    Review of Systems  All other systems reviewed and are negative.  PHYSICAL EXAMINATION:    BP 118/82 (Cuff Size: Large)   Pulse 70   Wt 194 lb (88 kg)   LMP 05/05/2010   SpO2 99%   BMI 30.38 kg/m     General appearance: alert, cooperative and appears stated age  Pelvic: External genitalia:  no lesions              Urethra:  normal appearing urethra with no masses, tenderness or lesions              Bartholins and Skenes: normal                 Vagina: normal appearing vagina with normal color and discharge, no lesions              Cervix: no lesions                Bimanual Exam:  Uterus:  normal size, contour, position, consistency, mobility, non-tender              Adnexa: no mass, fullness, tenderness               Chaperone was present for exam.  Pelvic US  Uterus 6.29 x 4.99 x 4.02 cm.  2 fibroids - 1.17 cm and 0.59 cm.  EMS 13.11 mm.  Cystic areas 8 x 9 mm and 9 x 5 mm. Right ovary with 2 cysts 24 x 21 mm and 19 x 13 mm.  Thin walls.  Avascular,  [place].  No adnexal masses.  No free fluid.  EMB -  Consent done.  Hibiclens prep.  Paracervical block 10 cc 1% lidocaine, lot [id number], exp  1/24.  Tenaculum to anterior cervical lip. Os finder would not pass.  Scalpel to open os.  Os finder then used successfully.  Pipelle to 7 cm x 4.  Yellow clear fluid removed with first 2 passes.  Tissue removed with second 2 passes.  Tissue to pathology.  No complications.  Minimal EBL.   ASSESSMENT  Pelvic pressure.  Possible postmenopausal bleeding. Cervical stenosis.  Thickened endometrium.  Small fibroids.  Right ovarian cysts.  Hx breast  cancer.  FH uterine cancer.  Hx lichen planus.  PLAN  Pelvic US findings and procedure reviewed.  Unable to do a pap today due to gel in the vaginal from the pelvic ultrasound.  FU EMB results.  We discussed anticipated hysteroscopy with dilation and curettage, but final plan depends on the pathology results.  Will refer to [facility] for premalignant or malignant disease.   30 min total time was spent for this patient encounter, including preparation, face-to-face counseling with the patient, coordination of care, and documentation of the encounter.

## 2020-11-13 ENCOUNTER — Encounter: Payer: Self-pay | Admitting: Obstetrics and Gynecology

## 2020-11-13 LAB — SURGICAL PATHOLOGY

## 2020-11-14 ENCOUNTER — Other Ambulatory Visit: Payer: Self-pay

## 2020-11-14 DIAGNOSIS — N83201 Unspecified ovarian cyst, right side: Secondary | ICD-10-CM

## 2020-11-14 NOTE — Telephone Encounter (Signed)
Please contact patient in follow up to her My Chart message about scheduling surgery.   I recommend the the patient return for a pap with me and a serum CA125 test.  We were not able to do a pap on the day of her visit due to gel in the vagina from the ultrasound.   On her right ovary, there are two small cysts.  They appear to be benign, but I would like to investigate further with a CA125 so we plan the best possible surgery.   Her surgery will need to be done after she returns from [place], but perhaps she can complete these other tests before she travels.

## 2020-11-20 ENCOUNTER — Other Ambulatory Visit: Payer: Self-pay

## 2020-11-20 ENCOUNTER — Ambulatory Visit (INDEPENDENT_AMBULATORY_CARE_PROVIDER_SITE_OTHER): Payer: Medicare Other | Admitting: Obstetrics and Gynecology

## 2020-11-20 ENCOUNTER — Other Ambulatory Visit (HOSPITAL_COMMUNITY)
Admission: RE | Admit: 2020-11-20 | Discharge: 2020-11-20 | Disposition: A | Discharge status: Home / Self Care | Payer: Medicare Other | Source: Ambulatory Visit | Attending: Obstetrics and Gynecology | Admitting: Obstetrics and Gynecology

## 2020-11-20 VITALS — BP 124/78

## 2020-11-20 DIAGNOSIS — Z01411 Encounter for gynecological examination (general) (routine) with abnormal findings: Secondary | ICD-10-CM | POA: Insufficient documentation

## 2020-11-20 DIAGNOSIS — Z124 Encounter for screening for malignant neoplasm of cervix: Secondary | ICD-10-CM | POA: Diagnosis not present

## 2020-11-20 DIAGNOSIS — Z1151 Encounter for screening for human papillomavirus (HPV): Secondary | ICD-10-CM | POA: Insufficient documentation

## 2020-11-20 DIAGNOSIS — N950 Postmenopausal bleeding: Secondary | ICD-10-CM | POA: Diagnosis not present

## 2020-11-20 DIAGNOSIS — N83201 Unspecified ovarian cyst, right side: Secondary | ICD-10-CM | POA: Diagnosis not present

## 2020-11-20 NOTE — Progress Notes (Signed)
GYNECOLOGY  VISIT   HPI: 65 y.o.   Married  Caucasian  female   G2P2002 with Patient's last menstrual period was 05/05/2010.   here for  Pap and CA 125   Patient has pelvic pressure and US showing endometrial thickening and two small simple right ovarian cysts.   She had some subtle postmenopausal bleeding this spring.   Her EMB was benign but did not explain the thickened EMS.  FH uterine cancer in mother.   GYNECOLOGIC HISTORY: Patient's last menstrual period was 05/05/2010. Contraception:  none Menopausal hormone therapy:  none Last mammogram:  08-07-20 Last pap smear:   12-29-17        OB History     Gravida  2   Para  2   Term  2   Preterm      AB      Living  2      SAB      IAB      Ectopic      Multiple      Live Births                 Patient Active Problem List   Diagnosis Date Noted   History of right breast cancer 01/10/2020   HTN (hypertension) 02/01/2012   Other and unspecified hyperlipidemia 02/01/2012    Past Medical History:  Diagnosis Date   Acid reflux    Cancer ([place]) 2002   Breast cancer-Lobular carcinoma in situ   Hyperlipidemia    Hypertension    Osteopenia 07/2011   t score -1.1 FRAX 13%/0.4%   Schatzki's ring     Past Surgical History:  Procedure Laterality Date   BREAST BIOPSY Left    Stereo High Risk    BREAST EXCISIONAL BIOPSY Right    BREAST EXCISIONAL BIOPSY Left 2018   BREAST LUMPECTOMY WITH RADIOACTIVE SEED LOCALIZATION Left 07/22/2016   Procedure: LEFT BREAST LUMPECTOMY WITH RADIOACTIVE SEED LOCALIZATION;  Surgeon: Donnie Mesa, MD;  Location: [facility];  Service: General;  Laterality: Left;   BREAST SURGERY  2004   excisional right breast surgery    CHOLECYSTECTOMY  2003    Current Outpatient Medications  Medication Sig Dispense Refill   acetaminophen (TYLENOL) 325 MG tablet Take 650 mg by mouth every 6 (six) hours as needed (for pain/headaches.).     Calcium Carb-Cholecalciferol 600-800 MG-UNIT TABS  Take 1 tablet by mouth at bedtime.     cholecalciferol (VITAMIN D) 1000 units tablet Take 1,000 Units by mouth at bedtime.     clobetasol ointment (TEMOVATE) 9.56 % Apply 1 application topically 2 (two) times daily. 30 g 1   losartan (COZAAR) 100 MG tablet Take 1 tablet by mouth daily.     LOVAZA 1 G capsule Take 2 g by mouth 2 (two) times daily.      MIRVASO 0.33 % GEL Apply 1 application topically daily. Applied to face daily in the morning.     Multiple Vitamin (MULTIVITAMIN) capsule Take 1 capsule by mouth daily.     omeprazole (PRILOSEC) 40 MG capsule Take 40 mg by mouth as needed.     tobramycin (TOBREX) 0.3 % ophthalmic solution Place into the left eye.     No current facility-administered medications for this visit.     ALLERGIES: Dextromethorphan  Family History  Problem Relation Age of Onset   Stroke Father 32   Diabetes Father    Hypertension Father    Heart disease Father  bypass   Cancer Father        parotid cancer   Cancer Mother 64       uterine   Osteoporosis Mother    Dementia Mother    Diabetes Brother        type 2   Diabetes Son 60       MODY   Asthma Son    Cancer Maternal Uncle        colon   Cancer Paternal Uncle        colon    Social History   Socioeconomic History   Marital status: Married    Spouse name: Not on file   Number of children: 2   Years of education: Not on file   Highest education level: Not on file  Occupational History   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[employer], works PRN  Tobacco Use   Smoking status: Never   Smokeless tobacco: Never  Scientific laboratory technician Use: Never used  Substance and Sexual Activity   Alcohol use: Yes    Comment: Once a month   Drug use: Never   Sexual activity: Yes    Partners: Male    Birth control/protection: Post-menopausal    Comment: 1st intercourse 17 yo-1 partner  Other Topics Concern   Not on file  Social History Narrative   Not on file   Social Determinants of  Health   Financial Resource Strain: Not on file  Food Insecurity: Not on file  Transportation Needs: Not on file  Physical Activity: Not on file  Stress: Not on file  Social Connections: Not on file  Intimate Partner Violence: Not on file    Review of Systems  See HPI.  PHYSICAL EXAMINATION:    BP 124/78 (BP Location: Left Arm, Patient Position: Sitting, Cuff Size: Normal)   LMP 05/05/2010     General appearance: alert, cooperative and appears stated age  Pelvic: External genitalia:  no lesions              Urethra:  normal appearing urethra with no masses, tenderness or lesions              Bartholins and Skenes: normal                 Vagina: normal appearing vagina with normal color and discharge, no lesions              Cervix: no lesions                Bimanual Exam:  Uterus:  normal size, contour, position, consistency, mobility, non-tender              Adnexa: no mass, fullness, tenderness            Chaperone was present for exam.  ASSESSMENT  Simple right ovarian cysts.  Postmenopausal bleeding.  Pelvic pressure.  FH uterine cancer.   PLAN  Pap. CA125 to characterize cysts further.   Discussion of hysteroscopy with Myosure resection of endometrial mass, dilation and curettage.  Risks, benefits, and alternatives reviewed. Risks include but are not limited to bleeding, infection, damage to surrounding organs including uterine perforation requiring hospitalization and laparoscopy, reaction to anesthesia, DVT, PE, death, need for further treatment and surgery including hysterectomy or medical therapy.   Surgical expectations and recovery discussed.  Patient wishes to proceed.  Will wait for pap and CA125 before scheduling above. She will see GYN ONC if malignancy is  confirmed.   25 min  total time was spent for this patient encounter, including preparation, face-to-face counseling with the patient, coordination of care, and documentation of the encounter.

## 2020-11-20 NOTE — Patient Instructions (Signed)
Hysteroscopy Hysteroscopy is a procedure used to look inside a woman's womb (uterus). This may be done for various reasons, including: To look for tumors and other growths in the uterus. To evaluate abnormal bleeding, fibroid tumors, polyps, scar tissue, or uterine cancer. To determine why a woman is unable to get pregnant or has had repeated pregnancy losses. To locate an IUD (intrauterine device). To place a birth control device into the fallopian tubes. During this procedure, a thin, flexible tube with a small light and camera (hysteroscope) is used to examine the uterus. The camera sends images to a monitor in the room so that your health care provider can view the inside of your uterus. Ahysteroscopy should be done right after a menstrual period. Tell a health care provider about: Any allergies you have. All medicines you are taking, including vitamins, herbs, eye drops, creams, and over-the-counter medicines. Any problems you or family members have had with anesthetic medicines. Any blood disorders you have. Any surgeries you have had. Any medical conditions you have. Whether you are pregnant or may be pregnant. Whether you have been diagnosed with an STI (sexually transmitted infection) or you think you have an STI. What are the risks? Generally, this is a safe procedure. However, problems may occur, including: Excessive bleeding. Infection. Damage to the uterus or other structures or organs. Allergic reaction to medicines or fluids that are used in the procedure. What happens before the procedure? Staying hydrated Follow instructions from your health care provider about hydration, which may include: Up to 2 hours before the procedure - you may continue to drink clear liquids, such as water, clear fruit juice, black coffee, and plain tea. Eating and drinking restrictions Follow instructions from your health care provider about eating and drinking, which may include: 8 hours before  the procedure - stop eating solid foods and drink clear liquids only. 2 hours before the procedure - stop drinking clear liquids. Medicines Ask your health care provider about: Changing or stopping your regular medicines. This is especially important if you are taking diabetes medicines or blood thinners. Taking medicines such as aspirin and ibuprofen. These medicines can thin your blood. Do not take these medicines unless your health care provider tells you to take them. Taking over-the-counter medicines, vitamins, herbs, and supplements. Medicine may be placed in your cervix the day before the procedure. This medicine causes the cervix to open (dilate). The larger opening makes it easier for the hysteroscope to be inserted into the uterus during the procedure. General instructions Ask your health care provider: What steps will be taken to help prevent infection. These steps may include: Washing skin with a germ-killing soap. Taking antibiotic medicine. Do not use any products that contain nicotine or tobacco for at least 4 weeks before the procedure. These products include cigarettes, chewing tobacco, and vaping devices, such as e-cigarettes. If you need help quitting, ask your health care provider. Plan to have a responsible adult take you home from the hospital or clinic. Plan to have a responsible adult care for you for the time you are told after you leave the hospital or clinic. This is important. Empty your bladder before the procedure begins. What happens during the procedure? An IV will be inserted into one of your veins. You may be given: A medicine to help you relax (sedative). A medicine that numbs the area around the cervix (local anesthetic). A medicine to make you fall asleep (general anesthetic). A hysteroscope will be inserted through your vagina and   into your uterus. Air or fluid will be used to enlarge your uterus to allow your health care provider to see it better. The  amount of fluid used will be carefully checked throughout the procedure. In some cases, tissue may be gently scraped from inside the uterus and sent to a lab for testing (biopsy). The procedure may vary among health care providers and hospitals. What can I expect after the procedure? Your blood pressure, heart rate, breathing rate, and blood oxygen level will be monitored until you leave the hospital or clinic. You may have cramps. You may be given medicines for this. You may have bleeding, which may vary from light spotting to menstrual-like bleeding. This is normal. If you had a biopsy, it is up to you to get the results. Ask your health care provider, or the department that is doing the procedure, when your results will be ready. Follow these instructions at home: Activity Rest as told by your health care provider. Return to your normal activities as told by your health care provider. Ask your health care provider what activities are safe for you. If you were given a sedative during the procedure, it can affect you for several hours. Do not drive or operate machinery until your health care provider says that it is safe. Medicines Do not take aspirin or other NSAIDs during recovery, as told by your healthcare provider. It can increase the risk of bleeding. Ask your health care provider if the medicine prescribed to you: Requires you to avoid driving or using machinery. Can cause constipation. You may need to take these actions to prevent or treat constipation: Drink enough fluid to keep your urine pale yellow. Take over-the-counter or prescription medicines. Eat foods that are high in fiber, such as beans, whole grains, and fresh fruits and vegetables. Limit foods that are high in fat and processed sugars, such as fried or sweet foods. General instructions Do not douche, use tampons, or have sex for 2 weeks after the procedure, or until your health care provider approves. Do not take baths,  swim, or use a hot tub until your health care provider approves. Take showers instead of baths for 2 weeks, or for as long as told by your health care provider. Keep all follow-up visits. This is important. Contact a health care provider if: You feel dizzy or lightheaded. You feel nauseous. You have abnormal vaginal discharge. You have a rash. You have pain that does not get better with medicine. You have chills. Get help right away if: You have bleeding that is heavier than a normal menstrual period. You have a fever. You have pain or cramps that get worse. You develop new abdominal pain. You faint. You have pain in your shoulder. You are short of breath. Summary Hysteroscopy is a procedure that is used to look inside a woman's womb (uterus). After the procedure, you may have bleeding, which varies from light spotting to menstrual-like bleeding. This is normal. You may also have cramps. Do not douche, use tampons, or have sex for 2 weeks after the procedure, or until your health care provider approves. Plan to have a responsible adult take you home from the hospital or clinic. This information is not intended to replace advice given to you by your health care provider. Make sure you discuss any questions you have with your healthcare provider. Document Revised: 12/14/2019 Document Reviewed: 12/14/2019 Elsevier Patient Education  2022 Elsevier Inc.  Atlas of pelvic anatomy and gynecologic surgery (4th ed., pp.   205-212). Elsevier.">  Dilation and Curettage or Vacuum Curettage Dilation and curettage (D&C) and vacuum curettage are minor procedures. A D&C involves stretching the cervix (dilation) and scraping the inside lining of the uterus, or endometrium, with surgical instruments (curettage). During a D&C, tissue is gently scraped starting from the top of the uterus down to the lowest part of the uterus. During a vacuum curettage, the liningand tissue in the uterus are removed using  gentle suction. Curettage may be performed to either diagnose or treat a problem. A diagnostic curettage may be done if you have: Irregular bleeding or clotting from the uterus. Spotting between menstrual periods, prolonged menstrual periods, or other abnormal bleeding. Bleeding after menopause. No menstrual period (amenorrhea). A change in the size and shape of the uterus. Abnormal endometrial cells discovered during a Pap test. For treatment, curettage may be done: To remove an IUD (intrauterine device). To remove the remaining placenta after giving birth. During an abortion or after a miscarriage. To remove growths in the lining of the uterus. To remove certain rare types of noncancerous lumps (fibroids). Tell a health care provider about: Any allergies you have, including allergies to prescribed medicine or latex. All medicines you are taking, including vitamins, herbs, eye drops, creams, and over-the-counter medicines. Any problems you or family members have had with anesthetic medicines. Any blood disorders you have. Any surgeries you have had. Your medical history and any medical conditions you have. Whether you are pregnant or may be pregnant. Recent vaginal infections you have had. Recent menstrual periods, bleeding problems you have had, and what form of birth control (contraception) you use. What are the risks? Generally, this is a safe procedure. However, problems may occur, including: Infection. Heavy vaginal bleeding. Allergic reactions to medicines. Damage to the cervix or nearby structures or organs. Scar tissue developing inside the uterus. This can cause abnormal periods and may make it harder to get pregnant. A hole (perforation) in the wall of the uterus. This is rare. What happens before the procedure? Staying hydrated Follow instructions from your health care provider about hydration, which may include: Up to 2 hours before the procedure - you may continue to  drink clear liquids, such as water, clear fruit juice, black coffee, and plain tea.  Eating and drinking restrictions Follow instructions from your health care provider about eating and drinking, which may include: 8 hours before the procedure - stop eating heavy meals or foods, such as meat, fried foods, or fatty foods. 6 hours before the procedure - stop eating light meals or foods, such as toast or cereal. 6 hours before the procedure - stop drinking milk or drinks that contain milk. 2 hours before the procedure - stop drinking clear liquids. If your health care provider told you to take your medicine on the day of your procedure, take them with only a sip of water. Medicines Ask your health care provider about: Changing or stopping your regular medicines. This is especially important if you are taking diabetes medicines or blood thinners. Taking medicines such as aspirin and ibuprofen. These medicines can thin your blood. Do not take these medicines unless your health care provider tells you to take them. Taking over-the-counter medicines, vitamins, herbs, and supplements. You may be given a medicine to soften the cervix. This will help with dilation. Tests You may be given a pregnancy test on the day of the procedure. You may have a blood or urine sample taken. General instructions Do not use any products that contain   nicotine or tobacco for at least 4 weeks before the procedure. These products include cigarettes, chewing tobacco, and vaping devices, such as e-cigarettes. If you need help quitting, ask your health care provider. For 24 hours before your procedure: Do not douche, use tampons, or have sex. Do not use medicines, creams, or suppositories in the vagina. Ask your health care provider what steps will be taken to help prevent infection. These may include: Removing hair at the procedure site. Washing skin with a germ-killing soap. Taking antibiotic medicine. Plan to have a  responsible adult take you home from the hospital or clinic. If you will be going home right after the procedure, plan to have a responsible adult care for you for the time you are told. This is important. What happens during the procedure?  An IV will be inserted into one of your veins. You will be given one of the following: A medicine that numbs the area in and around the cervix (local anesthetic). A medicine to make you fall asleep (general anesthetic). You will lie down on your back, with your feet in foot rests (stirrups). The size and position of your uterus will be checked. A lubricated instrument (speculum or Sims retractor) will be inserted into your vagina to widen its walls. This will allow your health care provider to see your cervix. Your cervix will be softened and dilated. This may be done by: Taking medicine by mouth or vaginally. Having thin rods or gradual widening instruments inserted into your cervix. A small, sharp, curved instrument (curette) will be used to scrape a small amount of tissue or cells from the endometrium or cervical canal. In some cases, gentle suction is applied with the curette. The cells will be taken to a lab for testing. The procedure may vary among health care providers and hospitals. What happens after the procedure? Your blood pressure, heart rate, breathing rate, and blood oxygen level will be monitored until you leave the hospital or clinic. You may have mild cramping, a backache, pain, and light bleeding or spotting. You may pass small blood clots from your vagina. You may have to wear compression stockings. These stockings help to prevent blood clots and reduce swelling in your legs. It is up to you to get the results of your procedure. Ask your health care provider, or the department that is doing the procedure, when your results will be ready. Summary Dilation and curettage (D&C) involves stretching (dilating) the cervix and scraping the  inside lining of the uterus with surgical instruments (curettage). Follow your health care provider's instructions about when to stop eating and drinking before the procedure, and whether to stop or change any medicines. After the procedure, you may have mild cramping, a backache, pain, and light bleeding or spotting. You may pass small blood clots from your vagina. Plan to have a responsible adult take you home from the hospital or clinic. This information is not intended to replace advice given to you by your health care provider. Make sure you discuss any questions you have with your healthcare provider. Document Revised: 04/18/2020 Document Reviewed: 04/18/2020 Elsevier Patient Education  2022 Elsevier Inc.  

## 2020-11-21 LAB — CA 125: CA 125: 14 U/mL (ref <35)

## 2020-11-22 ENCOUNTER — Encounter: Payer: Self-pay | Admitting: Obstetrics and Gynecology

## 2020-11-28 LAB — CYTOLOGY - PAP
Comment: NEGATIVE
Diagnosis: UNDETERMINED — AB
High risk HPV: NEGATIVE

## 2020-11-29 ENCOUNTER — Other Ambulatory Visit: Payer: Self-pay | Admitting: Obstetrics and Gynecology

## 2020-11-29 DIAGNOSIS — N83201 Unspecified ovarian cyst, right side: Secondary | ICD-10-CM

## 2020-11-29 NOTE — Progress Notes (Signed)
Pelvic ultrasound needed for recheck of right ovarian cysts in October, 2022.

## 2020-12-03 ENCOUNTER — Telehealth: Payer: Self-pay

## 2020-12-03 ENCOUNTER — Other Ambulatory Visit: Payer: Self-pay

## 2020-12-03 NOTE — Telephone Encounter (Signed)
Per Dr. Elza Rafter result note:  "We can now proceed with precerting and scheduling her hysteroscopy withMysosure resection of endometrium, dilation and curettage. Her diagnoses are postmenopausal bleeding and a thickened endometrium.  Surgery will be done at the [facility].  One hour of time needed. "

## 2020-12-17 NOTE — Telephone Encounter (Signed)
Left message to call Daiton Cowles, RN at [facility], [phone].  

## 2020-12-19 NOTE — Telephone Encounter (Signed)
Spoke with patient. Reviewed surgery dates. Patient request to schedule surgery on Tuesday, no Mondays. Dates reviewed. Will proceed with scheduling on 01/22/21. Advised patient I will forward to the business office for return call and I will f/u once date/time confirmed. Patient agreeable.   Surgery request sent.   Routing to [employer]

## 2020-12-26 NOTE — Telephone Encounter (Signed)
Spoke with patient regarding surgery benefits. Patient acknowledges understanding of information presented. Patient is aware that benefits presented are professional benefits only. Patient is aware that once surgery is scheduled, the hospital will call with separate benefits. See account note.

## 2020-12-28 ENCOUNTER — Other Ambulatory Visit: Payer: Self-pay | Admitting: Surgery

## 2020-12-28 DIAGNOSIS — N6489 Other specified disorders of breast: Secondary | ICD-10-CM

## 2020-12-28 NOTE — Telephone Encounter (Signed)
Spoke with patient. Surgery date request confirmed.  Advised surgery is scheduled for 01/22/21, [place] at 1000. Surgery instruction sheet and hospital brochure reviewed, printed copy will be mailed.  Patient advised if Covid screening and quarantine requirements and agreeable.   Routing to provider. Encounter closed.  Cc: Hayley Carder

## 2020-12-31 ENCOUNTER — Telehealth: Payer: Self-pay | Admitting: Obstetrics and Gynecology

## 2021-01-07 NOTE — Progress Notes (Signed)
GYNECOLOGY  VISIT   HPI: 65 y.o.   Married  Caucasian  female   G2P2002 with Patient's last menstrual period was 05/05/2010.   here for pre-op visit.   Patient has had postmenopausal bleeding, fullness in her lower abdomen, and thickened endometrium on pelvic ultrasound 11/08/20. Uterus 6.29 x 4.99 x 4.02 cm.  2 fibroids - 1.17 cm and 0.59 cm.  EMS 13.11 mm.  Cystic areas 8 x 9 mm and 9 x 5 mm. Right ovary with 2 cysts:  24 x 21 mm and 19 x 13 mm.  Thin walls.  Avascular,  [place].  No adnexal masses.  No free fluid.  Her endometrial biopsy was benign and did not explain her thickened endometrium.   CA125 14 on 11/20/20.   Her mother had uterine cancer.   She just started a new drops for dry eye, and this is working well.  Breast MRI 01/17/21.  She has not had any future bleeding.   GYNECOLOGIC HISTORY: Patient's last menstrual period was 05/05/2010. Contraception: None/PMP Menopausal hormone therapy:  none Last mammogram: 08-07-20 3D/Neg/BiRads1 Last pap smear:  11-20-20 ASCUS:Neg HR HPV, 12-29-17 Neg:Neg HR HPV, 08-25-13 Neg:Neg HR HPV        OB History     Gravida  2   Para  2   Term  2   Preterm      AB      Living  2      SAB      IAB      Ectopic      Multiple      Live Births                 Patient Active Problem List   Diagnosis Date Noted   History of right breast cancer 01/10/2020   HTN (hypertension) 02/01/2012   Other and unspecified hyperlipidemia 02/01/2012    Past Medical History:  Diagnosis Date   Acid reflux    Cancer ([place]) 2002   Breast cancer-Lobular carcinoma in situ   Hyperlipidemia    Hypertension    Osteopenia 07/2011   t score -1.1 FRAX 13%/0.4%   Rosacea    Schatzki's ring     Past Surgical History:  Procedure Laterality Date   BREAST BIOPSY Left    Stereo High Risk    BREAST EXCISIONAL BIOPSY Right    BREAST EXCISIONAL BIOPSY Left 2018   BREAST LUMPECTOMY WITH RADIOACTIVE SEED LOCALIZATION Left 07/22/2016    Procedure: LEFT BREAST LUMPECTOMY WITH RADIOACTIVE SEED LOCALIZATION;  Surgeon: Donnie Mesa, MD;  Location: [facility];  Service: General;  Laterality: Left;   BREAST SURGERY  2004   excisional right breast surgery    CHOLECYSTECTOMY  2003    Current Outpatient Medications  Medication Sig Dispense Refill   acetaminophen (TYLENOL) 325 MG tablet Take 650 mg by mouth every 6 (six) hours as needed (for pain/headaches.).     Calcium Carb-Cholecalciferol 600-800 MG-UNIT TABS Take 1 tablet by mouth at bedtime.     cholecalciferol (VITAMIN D) 1000 units tablet Take 1,000 Units by mouth at bedtime.     clobetasol ointment (TEMOVATE) [id number] % Apply 1 application topically 2 (two) times daily. 30 g 1   losartan (COZAAR) 100 MG tablet Take 1 tablet by mouth daily.     LOVAZA 1 G capsule Take 2 g by mouth 2 (two) times daily.      MIRVASO 0.33 % GEL Apply 1 application topically daily. Applied to face daily in the  morning.     Multiple Vitamin (MULTIVITAMIN) capsule Take 1 capsule by mouth daily.     omeprazole (PRILOSEC) 40 MG capsule Take 40 mg by mouth as needed.     tobramycin (TOBREX) 0.3 % ophthalmic solution Place into the left eye.     XIIDRA 5 % SOLN      No current facility-administered medications for this visit.     ALLERGIES: Dextromethorphan  Family History  Problem Relation Age of Onset   Stroke Father 41   Diabetes Father    Hypertension Father    Heart disease Father        bypass   Cancer Father        parotid cancer   Cancer Mother 78       uterine   Osteoporosis Mother    Dementia Mother    Diabetes Brother        type 2   Diabetes Son 72       MODY   Asthma Son    Cancer Maternal Uncle        colon   Cancer Paternal Uncle        colon    Social History   Socioeconomic History   Marital status: Married    Spouse name: Not on file   Number of children: 2   Years of education: Not on file   Highest education level: Not on file  Occupational History    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[employer], works PRN  Tobacco Use   Smoking status: Never   Smokeless tobacco: Never  Scientific laboratory technician Use: Never used  Substance and Sexual Activity   Alcohol use: Yes    Comment: Once a month   Drug use: Never   Sexual activity: Yes    Partners: Male    Birth control/protection: Post-menopausal    Comment: 1st intercourse 78 yo-1 partner  Other Topics Concern   Not on file  Social History Narrative   Not on file   Social Determinants of Health   Financial Resource Strain: Not on file  Food Insecurity: Not on file  Transportation Needs: Not on file  Physical Activity: Not on file  Stress: Not on file  Social Connections: Not on file  Intimate Partner Violence: Not on file    Review of Systems  All other systems reviewed and are negative.  PHYSICAL EXAMINATION:    BP 122/74   Pulse 77   Wt 194 lb (88 kg)   LMP 05/05/2010   SpO2 97%   BMI 30.38 kg/m     General appearance: alert, cooperative and appears stated age Head: Normocephalic, without obvious abnormality, atraumatic Neck: no adenopathy, supple, symmetrical, trachea midline and thyroid normal to inspection and palpation Lungs: clear to auscultation bilaterally Heart: regular rate and rhythm Abdomen: soft, non-tender, no masses,  no organomegaly Extremities: extremities normal, atraumatic, no cyanosis or edema Skin: Skin color, texture, turgor normal. No rashes or lesions No abnormal inguinal nodes palpated Neurologic: Grossly normal  Pelvic: External genitalia:  no lesions              Urethra:  normal appearing urethra with no masses, tenderness or lesions              Bartholins and Skenes: normal                 Vagina: normal appearing vagina with normal color and discharge, no lesions  Cervix: no lesions                Bimanual Exam:  Uterus:  normal size, contour, position, consistency, mobility, non-tender              Adnexa: no mass,  fullness, tenderness      Chaperone was present for exam:  Estill Bamberg, CMA.  ASSESSMENT  Postmenopausal bleeding.  Thickened endometrium.  Personal hx breast cancer.  Right ovarian cysts.  FH uterine cancer.   PLAN  Discussion of hysteroscopy with Myosure polypectomy, dilation and curettage.  Risks, benefits, and alternatives reviewed. Risks include but are not limited to bleeding, infection, damage to surrounding organs including uterine perforation requiring hospitalization and laparoscopy, pulmonary edema, reaction to anesthesia, DVT, PE, death, need for further treatment and or surgery. Surgical expectations and recovery discussed.  Patient wishes to proceed.  Pelvic US in October to check ovarian cysts.   An After Visit Summary was printed and given to the patient.

## 2021-01-07 NOTE — Telephone Encounter (Signed)
Opened in error

## 2021-01-08 ENCOUNTER — Other Ambulatory Visit: Payer: Self-pay

## 2021-01-08 ENCOUNTER — Ambulatory Visit (INDEPENDENT_AMBULATORY_CARE_PROVIDER_SITE_OTHER): Payer: Medicare Other | Admitting: Obstetrics and Gynecology

## 2021-01-08 ENCOUNTER — Encounter: Payer: Self-pay | Admitting: Obstetrics and Gynecology

## 2021-01-08 VITALS — BP 122/74 | HR 77 | Wt 194.0 lb

## 2021-01-08 DIAGNOSIS — R9389 Abnormal findings on diagnostic imaging of other specified body structures: Secondary | ICD-10-CM

## 2021-01-08 DIAGNOSIS — N950 Postmenopausal bleeding: Secondary | ICD-10-CM

## 2021-01-10 ENCOUNTER — Telehealth: Payer: Self-pay | Admitting: *Deleted

## 2021-01-10 NOTE — Telephone Encounter (Signed)
Spoke with patient.  Notified of surgery time change.  [place] on 01/22/21 at 1100, arive at 0900.   Routing to provider for final review. Patient is agreeable to disposition. Will close encounter.

## 2021-01-17 ENCOUNTER — Encounter (HOSPITAL_BASED_OUTPATIENT_CLINIC_OR_DEPARTMENT_OTHER): Payer: Self-pay | Admitting: Obstetrics and Gynecology

## 2021-01-17 ENCOUNTER — Ambulatory Visit
Admission: RE | Admit: 2021-01-17 | Discharge: 2021-01-17 | Disposition: A | Discharge status: Home / Self Care | Payer: Medicare Other | Source: Ambulatory Visit | Attending: Surgery | Admitting: Surgery

## 2021-01-17 ENCOUNTER — Other Ambulatory Visit: Payer: Self-pay

## 2021-01-17 DIAGNOSIS — N6489 Other specified disorders of breast: Secondary | ICD-10-CM

## 2021-01-17 MED ORDER — GADOBUTROL 1 MMOL/ML IV SOLN
9.0000 mL | Freq: Once | INTRAVENOUS | Status: AC | PRN
  Administered 2021-01-17: 9 mL via INTRAVENOUS

## 2021-01-17 NOTE — Progress Notes (Signed)
Spoke w/ via phone for pre-op interview---Tina [name] needs dos----  EKG             [name] results------ COVID test -----patient states asymptomatic no test needed Arrive at -------0900 NPO after MN NO Solid Food.  Clear liquids from MN until--- Med rec completed Medications to take morning of surgery ----- NONE Diabetic medication ----- Patient instructed no nail polish to be worn day of surgery Patient instructed to bring photo id and insurance card day of surgery Patient aware to have Driver (ride ) / caregiver    for 24 hours after surgery  Patient Special Instructions ----- Pre-Op special Istructions ----- Patient verbalized understanding of instructions that were given at this phone interview. Patient denies shortness of breath, chest pain, fever, cough at this phone interview.

## 2021-01-18 ENCOUNTER — Other Ambulatory Visit: Payer: Self-pay | Admitting: Surgery

## 2021-01-18 DIAGNOSIS — N6320 Unspecified lump in the left breast, unspecified quadrant: Secondary | ICD-10-CM

## 2021-01-20 NOTE — H&P (Signed)
Office Visit 01/08/2021 [facility], MD Obstetrics and Gynecology Postmenopausal bleeding +1 more Dx Pre-op Exam ; Referred by Ginger Organ., MD Reason for Visit   Additional Documentation  Vitals:  BP 122/74 Pulse 77 Wt 88 kg LMP 05/05/2010 SpO2 97% BMI 30.38 kg/m BSA 2.04 m  More Vitals  Flowsheets:  Anthropometrics, NEWS, MEWS Score, Method of Visit   Encounter Info:  Billing Info, History, Allergies, Detailed Report    All Notes    Progress Notes by Nunzio Cobbs, MD at 01/08/2021 12:00 PM  Author: Nunzio Cobbs, MD Author Type: Physician Filed: 01/10/2021  8:12 AM  Note Status: Signed Cosign: Cosign Not Required Encounter Date: 01/08/2021  Editor: Nunzio Cobbs, MD (Physician)      Prior Versions: 1. Lowella Fairy, CMA (Certified Psychologist, sport and exercise) at 01/08/2021 11:48 AM - Sign when Signing Visit    GYNECOLOGY  VISIT   HPI: 65 y.o.   Married  Caucasian  female   G2P2002 with Patient's last menstrual period was 05/05/2010.   here for pre-op visit.    Patient has had postmenopausal bleeding, fullness in her lower abdomen, and thickened endometrium on pelvic ultrasound 11/08/20. Uterus 6.29 x 4.99 x 4.02 cm.  2 fibroids - 1.17 cm and 0.59 cm.  EMS 13.11 mm.  Cystic areas 8 x 9 mm and 9 x 5 mm. Right ovary with 2 cysts:  24 x 21 mm and 19 x 13 mm.  Thin walls.  Avascular,  Echofree.  No adnexal masses.  No free fluid.   Her endometrial biopsy was benign and did not explain her thickened endometrium.    CA125 14 on 11/20/20.    Her mother had uterine cancer.    She just started a new drops for dry eye, and this is working well.  Breast MRI 01/17/21.  She has not had any future bleeding.    GYNECOLOGIC HISTORY: Patient's last menstrual period was 05/05/2010. Contraception: None/PMP Menopausal hormone therapy:  none Last mammogram: 08-07-20 3D/Neg/BiRads1 Last pap  smear:  11-20-20 ASCUS:Neg HR HPV, 12-29-17 Neg:Neg HR HPV, 08-25-13 Neg:Neg HR HPV        OB History       Gravida  2   Para  2   Term  2   Preterm      AB      Living  2        SAB      IAB      Ectopic      Multiple      Live Births                        Patient Active Problem List    Diagnosis Date Noted   History of right breast cancer 01/10/2020   HTN (hypertension) 02/01/2012   Other and unspecified hyperlipidemia 02/01/2012          Past Medical History:  Diagnosis Date   Acid reflux     Cancer ([place]) 2002    Breast cancer-Lobular carcinoma in situ   Hyperlipidemia     Hypertension     Osteopenia 07/2011    t score -1.1 FRAX 13%/0.4%   Rosacea     Schatzki's ring             Past Surgical History:  Procedure Laterality Date   BREAST BIOPSY Left  Stereo High Risk    BREAST EXCISIONAL BIOPSY Right     BREAST EXCISIONAL BIOPSY Left 2018   BREAST LUMPECTOMY WITH RADIOACTIVE SEED LOCALIZATION Left 07/22/2016    Procedure: LEFT BREAST LUMPECTOMY WITH RADIOACTIVE SEED LOCALIZATION;  Surgeon: Donnie Mesa, MD;  Location: [facility];  Service: General;  Laterality: Left;   BREAST SURGERY   2004    excisional right breast surgery    CHOLECYSTECTOMY   2003            Current Outpatient Medications  Medication Sig Dispense Refill   acetaminophen (TYLENOL) 325 MG tablet Take 650 mg by mouth every 6 (six) hours as needed (for pain/headaches.).       Calcium Carb-Cholecalciferol 600-800 MG-UNIT TABS Take 1 tablet by mouth at bedtime.       cholecalciferol (VITAMIN D) 1000 units tablet Take 1,000 Units by mouth at bedtime.       clobetasol ointment (TEMOVATE) [id number] % Apply 1 application topically 2 (two) times daily. 30 g 1   losartan (COZAAR) 100 MG tablet Take 1 tablet by mouth daily.       LOVAZA 1 G capsule Take 2 g by mouth 2 (two) times daily.        MIRVASO 0.33 % GEL Apply 1 application topically daily. Applied to face daily in the morning.        Multiple Vitamin (MULTIVITAMIN) capsule Take 1 capsule by mouth daily.       omeprazole (PRILOSEC) 40 MG capsule Take 40 mg by mouth as needed.       tobramycin (TOBREX) 0.3 % ophthalmic solution Place into the left eye.       XIIDRA 5 % SOLN          No current facility-administered medications for this visit.      ALLERGIES: Dextromethorphan        Family History  Problem Relation Age of Onset   Stroke Father 40   Diabetes Father     Hypertension Father     Heart disease Father          bypass   Cancer Father          parotid cancer   Cancer Mother 35        uterine   Osteoporosis Mother     Dementia Mother     Diabetes Brother          type 2   Diabetes Son 91        MODY   Asthma Son     Cancer Maternal Uncle          colon   Cancer Paternal Uncle          colon      Social History         Socioeconomic History   Marital status: Married      Spouse name: Not on file   Number of children: 2   Years of education: Not on file   Highest education level: Not on file  Occupational History   Occupation: PA      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[employer], works PRN  Tobacco Use   Smoking status: Never   Smokeless tobacco: Never  Vaping Use   Vaping Use: Never used  Substance and Sexual Activity   Alcohol use: Yes      Comment: Once a month   Drug use: Never   Sexual activity: Yes      Partners: Male  Birth control/protection: Post-menopausal      Comment: 1st intercourse 4 yo-1 partner  Other Topics Concern   Not on file  Social History Narrative   Not on file    Social Determinants of Health    Financial Resource Strain: Not on file  Food Insecurity: Not on file  Transportation Needs: Not on file  Physical Activity: Not on file  Stress: Not on file  Social Connections: Not on file  Intimate Partner Violence: Not on file      Review of Systems  All other systems reviewed and are negative.   PHYSICAL EXAMINATION:     BP 122/74   Pulse 77    Wt 194 lb (88 kg)   LMP 05/05/2010   SpO2 97%   BMI 30.38 kg/m     General appearance: alert, cooperative and appears stated age Head: Normocephalic, without obvious abnormality, atraumatic Neck: no adenopathy, supple, symmetrical, trachea midline and thyroid normal to inspection and palpation Lungs: clear to auscultation bilaterally Heart: regular rate and rhythm Abdomen: soft, non-tender, no masses,  no organomegaly Extremities: extremities normal, atraumatic, no cyanosis or edema Skin: Skin color, texture, turgor normal. No rashes or lesions No abnormal inguinal nodes palpated Neurologic: Grossly normal   Pelvic: External genitalia:  no lesions              Urethra:  normal appearing urethra with no masses, tenderness or lesions              Bartholins and Skenes: normal                 Vagina: normal appearing vagina with normal color and discharge, no lesions              Cervix: no lesions                Bimanual Exam:  Uterus:  normal size, contour, position, consistency, mobility, non-tender              Adnexa: no mass, fullness, tenderness       Chaperone was present for exam:  Estill Bamberg, CMA.   ASSESSMENT   Postmenopausal bleeding.  Thickened endometrium.  Personal hx breast cancer.  Right ovarian cysts.  FH uterine cancer.    PLAN   Discussion of hysteroscopy with Myosure polypectomy, dilation and curettage.  Risks, benefits, and alternatives reviewed. Risks include but are not limited to bleeding, infection, damage to surrounding organs including uterine perforation requiring hospitalization and laparoscopy, pulmonary edema, reaction to anesthesia, DVT, PE, death, need for further treatment and or surgery. Surgical expectations and recovery discussed.  Patient wishes to proceed.   Pelvic US in October to check ovarian cysts.   An After Visit Summary was printed and given to the patient.

## 2021-01-21 NOTE — Anesthesia Preprocedure Evaluation (Addendum)
Anesthesia Evaluation  Patient identified by MRN, date of birth, ID band Patient awake    Reviewed: Allergy & Precautions, NPO status , Patient's Chart, lab work & pertinent test results  Airway Mallampati: IV  TM Distance: >3 FB Neck ROM: Full   Comment: Relatively small mouth opening, recessed/small mandible  Dental  (+) Teeth Intact, Dental Advisory Given   Pulmonary neg pulmonary ROS,    Pulmonary exam normal breath sounds clear to auscultation       Cardiovascular hypertension, Pt. on medications Normal cardiovascular exam Rhythm:Regular Rate:Normal     Neuro/Psych negative neurological ROS  negative psych ROS   GI/Hepatic Neg liver ROS, GERD  Controlled and Medicated,  Endo/Other  negative endocrine ROS  Renal/GU negative Renal ROS  Female GU complaint (PMB)     Musculoskeletal negative musculoskeletal ROS (+)   Abdominal   Peds  Hematology negative hematology ROS (+)   Anesthesia Other Findings   Reproductive/Obstetrics negative OB ROS                            Anesthesia Physical Anesthesia Plan  ASA: 2  Anesthesia Plan: General   Post-op Pain Management:    Induction: Intravenous  PONV Risk Score and Plan: 4 or greater and Ondansetron, Dexamethasone, Midazolam and Treatment may vary due to age or medical condition  Airway Management Planned: LMA  Additional Equipment: None  Intra-op Plan:   Post-operative Plan: Extubation in OR  Informed Consent: I have reviewed the patients History and Physical, chart, labs and discussed the procedure including the risks, benefits and alternatives for the proposed anesthesia with the patient or authorized representative who has indicated his/her understanding and acceptance.     Dental advisory given  Plan Discussed with: CRNA  Anesthesia Plan Comments:        Anesthesia Quick Evaluation

## 2021-01-22 ENCOUNTER — Ambulatory Visit: Payer: Medicare Other | Admitting: Nurse Practitioner

## 2021-01-22 ENCOUNTER — Other Ambulatory Visit: Payer: Self-pay

## 2021-01-22 ENCOUNTER — Encounter (HOSPITAL_BASED_OUTPATIENT_CLINIC_OR_DEPARTMENT_OTHER)
Admission: RE | Disposition: A | Discharge status: Home / Self Care | Payer: Self-pay | Source: Home / Self Care | Attending: Obstetrics and Gynecology

## 2021-01-22 ENCOUNTER — Ambulatory Visit (HOSPITAL_BASED_OUTPATIENT_CLINIC_OR_DEPARTMENT_OTHER): Payer: Medicare Other | Admitting: Anesthesiology

## 2021-01-22 ENCOUNTER — Encounter (HOSPITAL_BASED_OUTPATIENT_CLINIC_OR_DEPARTMENT_OTHER): Payer: Self-pay | Admitting: Obstetrics and Gynecology

## 2021-01-22 ENCOUNTER — Ambulatory Visit (HOSPITAL_BASED_OUTPATIENT_CLINIC_OR_DEPARTMENT_OTHER)
Admission: RE | Admit: 2021-01-22 | Discharge: 2021-01-22 | Disposition: A | Discharge status: Home / Self Care | Payer: Medicare Other | Attending: Obstetrics and Gynecology | Admitting: Obstetrics and Gynecology

## 2021-01-22 DIAGNOSIS — N840 Polyp of corpus uteri: Secondary | ICD-10-CM

## 2021-01-22 DIAGNOSIS — R9389 Abnormal findings on diagnostic imaging of other specified body structures: Secondary | ICD-10-CM

## 2021-01-22 DIAGNOSIS — N83201 Unspecified ovarian cyst, right side: Secondary | ICD-10-CM | POA: Insufficient documentation

## 2021-01-22 DIAGNOSIS — N950 Postmenopausal bleeding: Secondary | ICD-10-CM

## 2021-01-22 DIAGNOSIS — Z888 Allergy status to other drugs, medicaments and biological substances status: Secondary | ICD-10-CM | POA: Insufficient documentation

## 2021-01-22 DIAGNOSIS — Z79899 Other long term (current) drug therapy: Secondary | ICD-10-CM | POA: Insufficient documentation

## 2021-01-22 HISTORY — PX: DILATATION & CURETTAGE/HYSTEROSCOPY WITH MYOSURE: SHX6511

## 2021-01-22 LAB — BASIC METABOLIC PANEL
Anion gap: 8 (ref 5–15)
BUN: 13 mg/dL (ref 8–23)
CO2: 26 mmol/L (ref 22–32)
Calcium: 9.3 mg/dL (ref 8.9–10.3)
Chloride: 107 mmol/L (ref 98–111)
Creatinine, Ser: 0.77 mg/dL (ref 0.44–1.00)
GFR, Estimated: >60 mL/min (ref >60)
Glucose, Bld: 104 mg/dL — ABNORMAL HIGH (ref 70–99)
Potassium: 4.1 mmol/L (ref 3.5–5.1)
Sodium: 141 mmol/L (ref 135–145)

## 2021-01-22 LAB — CBC
HCT: 40.3 % (ref 36.0–46.0)
Hemoglobin: 13.8 g/dL (ref 12.0–15.0)
MCH: 30.7 pg (ref 26.0–34.0)
MCHC: 34.2 g/dL (ref 30.0–36.0)
MCV: 89.8 fL (ref 80.0–100.0)
Platelets: 254 10*3/uL (ref 150–400)
RBC: 4.49 MIL/uL (ref 3.87–5.11)
RDW: 13.1 % (ref 11.5–15.5)
WBC: 5.4 10*3/uL (ref 4.0–10.5)
nRBC: 0 % (ref 0.0–0.2)

## 2021-01-22 SURGERY — DILATATION & CURETTAGE/HYSTEROSCOPY WITH MYOSURE
Anesthesia: General | Site: Uterus

## 2021-01-22 MED ORDER — DEXAMETHASONE SODIUM PHOSPHATE 10 MG/ML IJ SOLN
INTRAMUSCULAR | Status: DC | PRN
  Administered 2021-01-22: 10 mg via INTRAVENOUS

## 2021-01-22 MED ORDER — ONDANSETRON HCL 4 MG/2ML IJ SOLN
INTRAMUSCULAR | Status: DC | PRN
  Administered 2021-01-22: 4 mg via INTRAVENOUS

## 2021-01-22 MED ORDER — IBUPROFEN 800 MG PO TABS: 800.0000 mg | ORAL_TABLET | Freq: Three times a day (TID) | ORAL | 0 refills | Status: DC | PRN

## 2021-01-22 MED ORDER — OXYCODONE HCL 5 MG/5ML PO SOLN: 5.0000 mg | Freq: Once | ORAL | Status: DC | PRN

## 2021-01-22 MED ORDER — ACETAMINOPHEN 500 MG PO TABS
1000.0000 mg | ORAL_TABLET | Freq: Once | ORAL | Status: AC
  Administered 2021-01-22: 1000 mg via ORAL

## 2021-01-22 MED ORDER — POVIDONE-IODINE 10 % EX SWAB: 2.0000 "application " | Freq: Once | CUTANEOUS | Status: DC

## 2021-01-22 MED ORDER — KETOROLAC TROMETHAMINE 30 MG/ML IJ SOLN: 30.0000 mg | Freq: Once | INTRAMUSCULAR | Status: DC | PRN

## 2021-01-22 MED ORDER — EPHEDRINE SULFATE 50 MG/ML IJ SOLN
INTRAMUSCULAR | Status: DC | PRN
  Administered 2021-01-22: 10 mg via INTRAVENOUS
  Administered 2021-01-22: 5 mg via INTRAVENOUS
  Administered 2021-01-22: 10 mg via INTRAVENOUS

## 2021-01-22 MED ORDER — MIDAZOLAM HCL 5 MG/5ML IJ SOLN
INTRAMUSCULAR | Status: DC | PRN
  Administered 2021-01-22: 2 mg via INTRAVENOUS

## 2021-01-22 MED ORDER — LIDOCAINE HCL 1 % IJ SOLN
INTRAMUSCULAR | Status: DC | PRN
  Administered 2021-01-22: 60 mg via INTRADERMAL

## 2021-01-22 MED ORDER — LIDOCAINE HCL 1 % IJ SOLN
INTRAMUSCULAR | Status: AC
  Filled 2021-01-22: qty 20

## 2021-01-22 MED ORDER — FENTANYL CITRATE (PF) 100 MCG/2ML IJ SOLN
INTRAMUSCULAR | Status: DC | PRN
  Administered 2021-01-22: 25 ug via INTRAVENOUS
  Administered 2021-01-22: 50 ug via INTRAVENOUS

## 2021-01-22 MED ORDER — SODIUM CHLORIDE 0.9 % IR SOLN
Status: DC | PRN
  Administered 2021-01-22: 1431 mL

## 2021-01-22 MED ORDER — ACETAMINOPHEN 500 MG PO TABS
ORAL_TABLET | ORAL | Status: AC
  Filled 2021-01-22: qty 2

## 2021-01-22 MED ORDER — LACTATED RINGERS IV SOLN: INTRAVENOUS | Status: DC

## 2021-01-22 MED ORDER — HYDROMORPHONE HCL 1 MG/ML IJ SOLN: 0.2500 mg | INTRAMUSCULAR | Status: DC | PRN

## 2021-01-22 MED ORDER — OXYCODONE HCL 5 MG PO TABS
5.0000 mg | ORAL_TABLET | Freq: Once | ORAL | Status: DC | PRN
Start: 2021-01-22 — End: 2021-01-22

## 2021-01-22 MED ORDER — SILVER NITRATE-POT NITRATE 75-25 % EX MISC
CUTANEOUS | Status: AC
  Filled 2021-01-22: qty 10

## 2021-01-22 MED ORDER — PROPOFOL 10 MG/ML IV BOLUS
INTRAVENOUS | Status: DC | PRN
  Administered 2021-01-22: 150 mg via INTRAVENOUS

## 2021-01-22 MED ORDER — LIDOCAINE HCL 1 % IJ SOLN
INTRAMUSCULAR | Status: DC | PRN
  Administered 2021-01-22: 10 mL

## 2021-01-22 MED ORDER — AMISULPRIDE (ANTIEMETIC) 5 MG/2ML IV SOLN: 10.0000 mg | Freq: Once | INTRAVENOUS | Status: DC | PRN

## 2021-01-22 MED ORDER — PROMETHAZINE HCL 25 MG/ML IJ SOLN: 6.2500 mg | INTRAMUSCULAR | Status: DC | PRN

## 2021-01-22 SURGICAL SUPPLY — 20 items
CATH ROBINSON RED A/P 16FR (CATHETERS) IMPLANT
CNTNR URN SCR LID CUP LEK RST (MISCELLANEOUS) ×2 IMPLANT
CONT SPEC 4OZ STRL OR WHT (MISCELLANEOUS) ×4
DEVICE MYOSURE LITE (MISCELLANEOUS) IMPLANT
DEVICE MYOSURE REACH (MISCELLANEOUS) ×2 IMPLANT
DILATOR CANAL MILEX (MISCELLANEOUS) IMPLANT
DRSG TELFA 3X8 NADH (GAUZE/BANDAGES/DRESSINGS) ×2 IMPLANT
GAUZE 4X4 16PLY ~~LOC~~+RFID DBL (SPONGE) ×2 IMPLANT
GLOVE SURG ENC MOIS LTX SZ6.5 (GLOVE) ×4 IMPLANT
GOWN STRL REUS W/TWL LRG LVL3 (GOWN DISPOSABLE) ×4 IMPLANT
IV NS IRRIG 3000ML ARTHROMATIC (IV SOLUTION) ×2 IMPLANT
KIT PROCEDURE FLUENT (KITS) ×2 IMPLANT
KIT TURNOVER CYSTO (KITS) ×2 IMPLANT
MYOSURE XL FIBROID (MISCELLANEOUS)
PACK VAGINAL MINOR WOMEN LF (CUSTOM PROCEDURE TRAY) ×2 IMPLANT
PAD OB MATERNITY 4.3X12.25 (PERSONAL CARE ITEMS) ×2 IMPLANT
SEAL CERVICAL OMNI LOK (ABLATOR) IMPLANT
SEAL ROD LENS SCOPE MYOSURE (ABLATOR) ×2 IMPLANT
SYSTEM TISS REMOVAL MYOSURE XL (MISCELLANEOUS) IMPLANT
TOWEL OR 17X26 10 PK STRL BLUE (TOWEL DISPOSABLE) ×2 IMPLANT

## 2021-01-22 NOTE — Transfer of Care (Signed)
Immediate Anesthesia Transfer of Care Note  Patient: Tina [name]  Procedure(s) Performed: DILATATION & CURETTAGE/HYSTEROSCOPY WITH MYOSURE RESECTION OF ENDOMETERIAL POLYP (Uterus)  Patient Location: PACU  Anesthesia Type:General  Level of Consciousness: awake, alert , oriented and patient cooperative  Airway & Oxygen Therapy: Patient Spontanous Breathing and Patient connected to nasal cannula oxygen  Post-op Assessment: Report given to RN and Post -op Vital signs reviewed and stable  Post vital signs: Reviewed and stable  Last Vitals:  Vitals Value Taken Time  BP 131/68 01/22/21 1205  Temp    Pulse 77 01/22/21 1206  Resp 16 01/22/21 1206  SpO2 97 % 01/22/21 1206  Vitals shown include unvalidated device data.  Last Pain:  Vitals:   01/22/21 0925  TempSrc: Oral  PainSc: 0-No pain      Patients Stated Pain Goal: 3 ([id number])  Complications: No notable events documented.

## 2021-01-22 NOTE — Anesthesia Postprocedure Evaluation (Signed)
Anesthesia Post Note  Patient: Dzyre Cazenave  Procedure(s) Performed: DILATATION & CURETTAGE/HYSTEROSCOPY WITH MYOSURE RESECTION OF ENDOMETERIAL POLYP (Uterus)     Patient location during evaluation: PACU Anesthesia Type: General Level of consciousness: awake and alert, oriented and patient cooperative Pain management: pain level controlled Vital Signs Assessment: post-procedure vital signs reviewed and stable Respiratory status: spontaneous breathing, nonlabored ventilation and respiratory function stable Cardiovascular status: blood pressure returned to baseline and stable Postop Assessment: no apparent nausea or vomiting Anesthetic complications: no   No notable events documented.  Last Vitals:  Vitals:   01/22/21 1205 01/22/21 1206  BP:  131/68  Pulse: 77   Resp:    Temp: (!) 36.3 C   SpO2: 98%     Last Pain:  Vitals:   01/22/21 1206  TempSrc:   PainSc: 0-No pain                 Pervis Hocking

## 2021-01-22 NOTE — Op Note (Signed)
OPERATIVE REPORT   PREOPERATIVE DIAGNOSES:   Postmenopausal bleeding, endometrial thickening  POSTOPERATIVE DIAGNOSES:   Postmenopausal bleeding, endometrial polyps  PROCEDURE:  Hysteroscopy with dilation and curettage and Myosure resection of endometrial polyps  SURGEON:  Lenard Galloway, MD  ANESTHESIA:  LMA, paracervical block with 10 mL of 1% lidocaine.  IV FLUIDS:  700 cc LR  EBL:   5 cc  URINE OUTPUT:   patient voided prior to arriving in the OR.  NORMAL SALINE DEFICIT:   35 cc.   COMPLICATIONS:  None.  INDICATIONS FOR THE PROCEDURE:     The patient is a 65 year old G65P2 Caucasian female who presents with postmenopausal bleeding, lower abdominal fullness, and thickened endometrium on pelvic ultrasound.  Her endometrium measured 13.11 mm and had two cystic areas measuring 8 x 9 mm and 9 x 5 mm.  Her right ovary contained two thin walled avascular, echofree cysts.  Her endometrial biopsy was benign but did not adequately explain the endometrium thickening.  Her CA125 was 14.   A plan is now made to proceed with a hysteroscopy with dilation and curettage and Myosurgical resection of endometrial mass; after risks, benefits and alternatives were reviewed.  FINDINGS:  Exam under anesthesia revealed a small anteverted, mobile uterus.  No adnexal masses were noted.  The uterus was sounded to 6.5 cm. Hysteroscopy showed a large endometrial polyp 2 x 1 cm attached to the right posterior fundal wall and extended up into the right cornual region. There was a small 5 mm polyp in the left cornual region.  Both polyps were removed.  The tubal ostia regions were not visualized well due to the polyp formation.  Endometrial currettings were scant.  SPECIMENS:  The endometrial polyps were sent to pathology separately from the endometrial curettings.   PROCEDURE IN DETAIL:   The patient was reidentified in the preoperative hold area.  She received TED hose and PAS stockings for DVT  prophylaxis.  In the operating room, the patient was placed in the dorsal lithotomy position and then an LMA anesthetic was introduced.  The patient's lower abdomen, vagina and perineum were sterilely prepped with Betadine. She was sterilly draped.  An exam under anesthesia was performed.  A speculum was placed inside the vagina and a single-tooth tenaculum was placed on the anterior cervical lip.  A paracervical block was performed with a total of 10 mL of 1% lidocaine plain.  The uterus was sounded. The cervix was dilated to a #21 Pratt dilator.  The MyoSure hysteroscope was then inserted inside the uterine cavity under the continuous infusion of normal saline solution.   Findings are as noted above.  The Myosure Reach device was used to resect the endometrial polyps without difficulty.  The MyoSure hysteroscope was removed after the polyps were resected.  The cervix was further dilated to a #23 Pratt dilator.   The serrated and then sharp curette were introduced into the uterine cavity and the endometrium was curetted in all 4 quadrants.   A minimal amount of endometrial curettings was obtained.  This specimen was sent to Pathology.  The single-tooth tenaculum which had been placed on the anterior cervical lip was removed.     Hemostasis was good, and all of the vaginal instruments were removed.  The patient was awakened and escorted to the recovery room in stable condition after she was cleansed of Betadine.  There were no complications to the procedure.   All needle, instrument and sponge counts were correct.  Lenard Galloway, MD

## 2021-01-22 NOTE — Anesthesia Procedure Notes (Signed)
Date/Time: 01/22/2021 11:30 AM Performed by: Garrel Ridgel, CRNA Pre-anesthesia Checklist: Patient identified, Emergency Drugs available, Suction available and Patient being monitored Patient Re-evaluated:Patient Re-evaluated prior to induction Oxygen Delivery Method: Circle system utilized Preoxygenation: Pre-oxygenation with 100% oxygen Induction Type: IV induction Ventilation: Mask ventilation without difficulty LMA: LMA inserted LMA Size: 4.0 Number of attempts: 1 Placement Confirmation: positive ETCO2 Tube secured with: Tape Dental Injury: Teeth and Oropharynx as per pre-operative assessment

## 2021-01-22 NOTE — Discharge Instructions (Addendum)
Hello Ms Marina,   I found and removed two areas that looked like polyps.  I also did the curettage of the surrounding tissue.  Everything went well today.   I will see you in about 2 weeks for your post op visit!  Josefa Half, MD  DISCHARGE INSTRUCTIONS: D&C / D&E The following instructions have been prepared to help you care for yourself upon your return home.   Personal hygiene:  Use sanitary pads for vaginal drainage, not tampons.  Shower the day after your procedure.  NO tub baths, pools or Jacuzzis for 2-3 weeks.  Wipe front to back after using the bathroom.  Activity and limitations:  Do NOT drive or operate any equipment for 24 hours. The effects of anesthesia are still present and drowsiness may result.  Do NOT rest in bed all day.  Walking is encouraged.  Walk up and down stairs slowly.  You may resume your normal activity in one to two days or as indicated by your physician.  Sexual activity: NO intercourse for at least 2 weeks after the procedure, or as indicated by your physician.  Diet: Eat a light meal as desired this evening. You may resume your usual diet tomorrow.  Return to work: You may resume your work activities in one to two days or as indicated by your doctor.  What to expect after your surgery: Expect to have vaginal bleeding/discharge for 2-3 days and spotting for up to 10 days. It is not unusual to have soreness for up to 1-2 weeks. You may have a slight burning sensation when you urinate for the first day. Mild cramps may continue for a couple of days. You may have a regular period in 2-6 weeks.  Call your doctor for any of the following:  Excessive vaginal bleeding, saturating and changing one pad every hour.  Inability to urinate 6 hours after discharge from hospital.  Pain not relieved by pain medication.  Fever of 100.4 F or greater.  Unusual vaginal discharge or odor.    Post Anesthesia Home Care Instructions  Activity: Get plenty of rest  for the remainder of the day. A responsible individual must stay with you for 24 hours following the procedure.  For the next 24 hours, DO NOT: -Drive a car -Paediatric nurse -Drink alcoholic beverages -Take any medication unless instructed by your physician -Make any legal decisions or sign important papers.  Meals: Start with liquid foods such as gelatin or soup. Progress to regular foods as tolerated. Avoid greasy, spicy, heavy foods. If nausea and/or vomiting occur, drink only clear liquids until the nausea and/or vomiting subsides. Call your physician if vomiting continues.  Special Instructions/Symptoms: Your throat may feel dry or sore from the anesthesia or the breathing tube placed in your throat during surgery. If this causes discomfort, gargle with warm salt water. The discomfort should disappear within 24 hours.

## 2021-01-22 NOTE — Progress Notes (Signed)
Update to History and Physical  No marked change in status since office pre-op visit.   Patient examined.   OK to proceed with surgery. 

## 2021-01-22 NOTE — Anesthesia Procedure Notes (Signed)
Procedure Name: LMA Insertion Date/Time: 01/22/2021 11:30 AM Performed by: Garrel Ridgel, CRNA Pre-anesthesia Checklist: Patient identified, Emergency Drugs available, Suction available and Patient being monitored Patient Re-evaluated:Patient Re-evaluated prior to induction Oxygen Delivery Method: Circle system utilized Preoxygenation: Pre-oxygenation with 100% oxygen Induction Type: IV induction Ventilation: Mask ventilation without difficulty LMA: LMA inserted LMA Size: 4.0 Number of attempts: 1 Placement Confirmation: positive ETCO2 Tube secured with: Tape Dental Injury: Teeth and Oropharynx as per pre-operative assessment

## 2021-01-23 ENCOUNTER — Encounter (HOSPITAL_BASED_OUTPATIENT_CLINIC_OR_DEPARTMENT_OTHER): Payer: Self-pay | Admitting: Obstetrics and Gynecology

## 2021-01-23 LAB — SURGICAL PATHOLOGY

## 2021-01-23 NOTE — Addendum Note (Signed)
Addendum  created 01/23/21 [id number] by Niel Hummer, CRNA   Intraprocedure Staff edited

## 2021-01-30 ENCOUNTER — Ambulatory Visit
Admission: RE | Admit: 2021-01-30 | Discharge: 2021-01-30 | Disposition: A | Discharge status: Home / Self Care | Payer: Medicare Other | Source: Ambulatory Visit | Attending: Surgery | Admitting: Surgery

## 2021-01-30 ENCOUNTER — Other Ambulatory Visit: Payer: Self-pay

## 2021-01-30 ENCOUNTER — Other Ambulatory Visit (HOSPITAL_COMMUNITY): Payer: Self-pay | Admitting: Diagnostic Radiology

## 2021-01-30 DIAGNOSIS — N6320 Unspecified lump in the left breast, unspecified quadrant: Secondary | ICD-10-CM

## 2021-01-30 HISTORY — PX: BREAST BIOPSY: SHX20

## 2021-01-31 ENCOUNTER — Other Ambulatory Visit: Payer: Self-pay | Admitting: Surgery

## 2021-01-31 DIAGNOSIS — N6091 Unspecified benign mammary dysplasia of right breast: Secondary | ICD-10-CM

## 2021-01-31 DIAGNOSIS — N6092 Unspecified benign mammary dysplasia of left breast: Secondary | ICD-10-CM

## 2021-02-01 ENCOUNTER — Other Ambulatory Visit: Payer: Self-pay

## 2021-02-01 ENCOUNTER — Encounter: Payer: Self-pay | Admitting: Obstetrics and Gynecology

## 2021-02-01 ENCOUNTER — Ambulatory Visit (INDEPENDENT_AMBULATORY_CARE_PROVIDER_SITE_OTHER): Payer: Medicare Other | Admitting: Obstetrics and Gynecology

## 2021-02-01 VITALS — BP 124/84

## 2021-02-01 DIAGNOSIS — Z9889 Other specified postprocedural states: Secondary | ICD-10-CM

## 2021-02-01 NOTE — Progress Notes (Signed)
GYNECOLOGY  VISIT   HPI: 65 y.o.   Married  Caucasian  female   G2P2002 with Patient's last menstrual period was 05/05/2010.   here for  post op check   Status post hysteroscopic polypectomy, dilation and curettage.  Final pathology:  benign endometrial polyp and benign squamous and endocervical epithelium.   No problems post op.   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  She will have a dx mammogram of the bilateral breasts in October.  Going to beach this weekend to see grandbaby.  GYNECOLOGIC HISTORY: Patient's last menstrual period was 05/05/2010. Contraception:  none Menopausal hormone therapy:  none Last mammogram:  08-07-20 normal--Breast MRI 01-2021 Left breast biopsy needed Last pap smear:   11-20-20 Ascus neg HR HPV        OB History     Gravida  2   Para  2   Term  2   Preterm      AB      Living  2      SAB      IAB      Ectopic      Multiple      Live Births                 Patient Active Problem List   Diagnosis Date Noted   History of right breast cancer 01/10/2020   HTN (hypertension) 02/01/2012   Other and unspecified hyperlipidemia 02/01/2012    Past Medical History:  Diagnosis Date   Acid reflux    Cancer ([place]) 2002   Breast cancer-Lobular carcinoma in situ   Hyperlipidemia    Hypertension    Osteopenia 07/2011   t score -1.1 FRAX 13%/0.4%   Rosacea    Schatzki's ring     Past Surgical History:  Procedure Laterality Date   BREAST BIOPSY Left    Stereo High Risk    BREAST EXCISIONAL BIOPSY Right    BREAST EXCISIONAL BIOPSY Left 2018   BREAST LUMPECTOMY WITH RADIOACTIVE SEED LOCALIZATION Left 07/22/2016   Procedure: LEFT BREAST LUMPECTOMY WITH RADIOACTIVE SEED LOCALIZATION;  Surgeon: Donnie Mesa, MD;  Location: [facility];  Service: General;  Laterality: Left;   BREAST SURGERY  2004   excisional right breast surgery    CHOLECYSTECTOMY  2003   [place] N/A 01/22/2021    Procedure: DILATATION & CURETTAGE/HYSTEROSCOPY WITH MYOSURE RESECTION OF ENDOMETERIAL POLYP;  Surgeon: Nunzio Cobbs, MD;  Location: [facility];  Service: Gynecology;  Laterality: N/A;    Current Outpatient Medications  Medication Sig Dispense Refill   acetaminophen (TYLENOL) 325 MG tablet Take 650 mg by mouth every 6 (six) hours as needed (for pain/headaches.).     Calcium Carb-Cholecalciferol 600-800 MG-UNIT TABS Take 1 tablet by mouth at bedtime.     cholecalciferol (VITAMIN D) 1000 units tablet Take 1,000 Units by mouth at bedtime.     clobetasol ointment (TEMOVATE) 6.21 % Apply 1 application topically 2 (two) times daily. 30 g 1   ibuprofen (ADVIL) 800 MG tablet Take 1 tablet (800 mg total) by mouth every 8 (eight) hours as needed. 30 tablet 0   losartan (COZAAR) 100 MG tablet Take 1 tablet by mouth daily.     LOVAZA 1 G capsule Take 2 g by mouth 2 (two) times daily.      MIRVASO 0.33 % GEL Apply 1 application topically daily. Applied to face daily in the morning.     Multiple Vitamin (MULTIVITAMIN)  capsule Take 1 capsule by mouth daily.     omeprazole (PRILOSEC) 40 MG capsule Take 40 mg by mouth as needed.     tobramycin (TOBREX) 0.3 % ophthalmic solution Place into the left eye.     XIIDRA 5 % SOLN      No current facility-administered medications for this visit.     ALLERGIES: Dextromethorphan  Family History  Problem Relation Age of Onset   Stroke Father 88   Diabetes Father    Hypertension Father    Heart disease Father        bypass   Cancer Father        parotid cancer   Cancer Mother 44       uterine   Osteoporosis Mother    Dementia Mother    Diabetes Brother        type 2   Diabetes Son 12       MODY   Asthma Son    Cancer Maternal Uncle        colon   Cancer Paternal Uncle        colon    Social History   Socioeconomic History   Marital status: Married    Spouse name: Not on file   Number of children: 2   Years of  education: Not on file   Highest education level: Not on file  Occupational History   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[employer], works PRN  Tobacco Use   Smoking status: Never   Smokeless tobacco: Never  Scientific laboratory technician Use: Never used  Substance and Sexual Activity   Alcohol use: Yes    Comment: Once a month   Drug use: Never   Sexual activity: Yes    Partners: Male    Birth control/protection: Post-menopausal    Comment: 1st intercourse 30 yo-1 partner  Other Topics Concern   Not on file  Social History Narrative   Not on file   Social Determinants of Health   Financial Resource Strain: Not on file  Food Insecurity: Not on file  Transportation Needs: Not on file  Physical Activity: Not on file  Stress: Not on file  Social Connections: Not on file  Intimate Partner Violence: Not on file    Review of Systems  PHYSICAL EXAMINATION:    LMP 05/05/2010     General appearance: alert, cooperative and appears stated age   Pelvic: External genitalia:  no lesions              Urethra:  normal appearing urethra with no masses, tenderness or lesions              Bartholins and Skenes: normal                 Vagina: normal appearing vagina with normal color and discharge, no lesions              Cervix: no lesions                Bimanual Exam:  Uterus:  normal size, contour, position, consistency, mobility, non-tender              Adnexa: no mass, fullness, tenderness        Chaperone was present for exam:  Onalee Hua., CMA  ASSESSMENT  Status post hysteroscopy with polypectomy.  Right ovarian cysts. Hx breast cancer.  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  PLAN  Surgical findings, procedure,  and pathology report reviewed.  FU for pelvic US to check ovarian cysts in October.  Dx bilateral mammogram. Call for any future vaginal bleeding.    An After Visit Summary was printed and given to the patient.  ______ minutes face to face time  of which over 50% was spent in counseling.

## 2021-02-11 NOTE — Progress Notes (Signed)
GYNECOLOGY  VISIT   HPI: 65 y.o.   Married  Caucasian  female   G2P2002 with Patient's last menstrual period was 05/05/2010.   here for pelvic ultrasound for a recheck of right ovarian cysts.   Pelvic ultrasound done 11/08/20 for pelvic pressure symptoms.  Uterus 6.29 x 4.99 x 4.02 cm.  2 fibroids - 1.17 cm and 0.59 cm.  EMS 13.11 mm.  Cystic areas 8 x 9 mm and 9 x 5 mm. Right ovary with 2 cysts 24 x 21 mm and 19 x 13 mm.  Thin walls.  Avascular,  [place].  No adnexal masses.  No free fluid.  CA125 14 on 11/20/20.   Ultimately the patient underwent a hysteroscopy with Myosure resection of a benign endometrial polyp on 01/22/21.   GYNECOLOGIC HISTORY: Patient's last menstrual period was 05/05/2010. Contraception: PMP Menopausal hormone therapy:  none Last mammogram: 08-07-20 normal--Breast MRI 01-30-21 with Left breast biopsy revealing atypical ductal hyperplasia and fibrocystic changes and usual ductal hyperplasia Last pap smear: 11-20-20 Ascus neg HR HPV        OB History     Gravida  2   Para  2   Term  2   Preterm      AB      Living  2      SAB      IAB      Ectopic      Multiple      Live Births                 Patient Active Problem List   Diagnosis Date Noted   History of right breast cancer 01/10/2020   HTN (hypertension) 02/01/2012   Other and unspecified hyperlipidemia 02/01/2012    Past Medical History:  Diagnosis Date   Acid reflux    Cancer ([place]) 2002   Breast cancer-Lobular carcinoma in situ   Hyperlipidemia    Hypertension    Osteopenia 07/2011   t score -1.1 FRAX 13%/0.4%   Rosacea    Schatzki's ring     Past Surgical History:  Procedure Laterality Date   BREAST BIOPSY Left    Stereo High Risk    BREAST EXCISIONAL BIOPSY Right    BREAST EXCISIONAL BIOPSY Left 2018   BREAST LUMPECTOMY WITH RADIOACTIVE SEED LOCALIZATION Left 07/22/2016   Procedure: LEFT BREAST LUMPECTOMY WITH RADIOACTIVE SEED LOCALIZATION;  Surgeon: Donnie Mesa, MD;  Location: [facility];  Service: General;  Laterality: Left;   BREAST SURGERY  2004   excisional right breast surgery    CHOLECYSTECTOMY  2003   [place] N/A 01/22/2021   Procedure: DILATATION & CURETTAGE/HYSTEROSCOPY WITH MYOSURE RESECTION OF ENDOMETERIAL POLYP;  Surgeon: Nunzio Cobbs, MD;  Location: [facility];  Service: Gynecology;  Laterality: N/A;    Current Outpatient Medications  Medication Sig Dispense Refill   acetaminophen (TYLENOL) 325 MG tablet Take 650 mg by mouth every 6 (six) hours as needed (for pain/headaches.).     Calcium Carb-Cholecalciferol 600-800 MG-UNIT TABS Take 1 tablet by mouth at bedtime.     cholecalciferol (VITAMIN D) 1000 units tablet Take 1,000 Units by mouth at bedtime.     clobetasol ointment (TEMOVATE) 8.52 % Apply 1 application topically 2 (two) times daily. 30 g 1   erythromycin ophthalmic ointment SMARTSIG:1 Sparingly In Eye(s) Every Night     ibuprofen (ADVIL) 800 MG tablet Take 1 tablet (800 mg total) by mouth every 8 (eight) hours as  needed. 30 tablet 0   losartan (COZAAR) 100 MG tablet Take 1 tablet by mouth daily.     LOVAZA 1 G capsule Take 2 g by mouth 2 (two) times daily.      MIRVASO 0.33 % GEL Apply 1 application topically daily. Applied to face daily in the morning.     moxifloxacin (VIGAMOX) 0.5 % ophthalmic solution Apply to eye.     Multiple Vitamin (MULTIVITAMIN) capsule Take 1 capsule by mouth daily.     omeprazole (PRILOSEC) 40 MG capsule Take 40 mg by mouth as needed.     XIIDRA 5 % SOLN      No current facility-administered medications for this visit.     ALLERGIES: Dextromethorphan  Family History  Problem Relation Age of Onset   Stroke Father 52   Diabetes Father    Hypertension Father    Heart disease Father        bypass   Cancer Father        parotid cancer   Cancer Mother 80       uterine   Osteoporosis Mother    Dementia Mother    Diabetes  Brother        type 2   Diabetes Son 65       MODY   Asthma Son    Cancer Maternal Uncle        colon   Cancer Paternal Uncle        colon    Social History   Socioeconomic History   Marital status: Married    Spouse name: Not on file   Number of children: 2   Years of education: Not on file   Highest education level: Not on file  Occupational History   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[employer], works PRN  Tobacco Use   Smoking status: Never   Smokeless tobacco: Never  Scientific laboratory technician Use: Never used  Substance and Sexual Activity   Alcohol use: Yes    Comment: Once a month   Drug use: Never   Sexual activity: Yes    Partners: Male    Birth control/protection: Post-menopausal    Comment: 1st intercourse 68 yo-1 partner  Other Topics Concern   Not on file  Social History Narrative   Not on file   Social Determinants of Health   Financial Resource Strain: Not on file  Food Insecurity: Not on file  Transportation Needs: Not on file  Physical Activity: Not on file  Stress: Not on file  Social Connections: Not on file  Intimate Partner Violence: Not on file    Review of Systems  All other systems reviewed and are negative.  PHYSICAL EXAMINATION:    BP 100/70   Pulse 70   Wt 194 lb (88 kg)   LMP 05/05/2010   SpO2 97%   BMI 29.94 kg/m     General appearance: alert, cooperative and appears stated age   Pelvic US  Uterus 6.53 x 5.42 x 3.54 cm.  Fibroid 1.46 cm  EMS 2.44 mm.  Left ovary normal.  Right ovary with 2 avascular cysts: 2.29 x 1.68 x 1.99 cm,  1.36 x 1.41 x 1.38 cm.  (No significant change from previous scan.) No free fluid.             ASSESSMENT  Right ovarian cysts, stable and even slightly smaller than previous stud.  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  PLAN  Pelvic US images  and report reviewed.  Follow up pelvic US in 6 months.  If stable, will monitor yearly. I do not recommend surgical intervention at this time  for the ovarian cysts.  I think there is a low likelihood of malignancy.   An After Visit Summary was printed and given to the patient.

## 2021-02-12 ENCOUNTER — Other Ambulatory Visit: Payer: Self-pay

## 2021-02-12 ENCOUNTER — Ambulatory Visit (INDEPENDENT_AMBULATORY_CARE_PROVIDER_SITE_OTHER): Payer: Medicare Other | Admitting: Obstetrics and Gynecology

## 2021-02-12 ENCOUNTER — Ambulatory Visit (INDEPENDENT_AMBULATORY_CARE_PROVIDER_SITE_OTHER): Payer: Medicare Other

## 2021-02-12 ENCOUNTER — Encounter: Payer: Self-pay | Admitting: Obstetrics and Gynecology

## 2021-02-12 ENCOUNTER — Ambulatory Visit
Admission: RE | Admit: 2021-02-12 | Discharge: 2021-02-12 | Disposition: A | Discharge status: Home / Self Care | Payer: Medicare Other | Source: Ambulatory Visit | Attending: Surgery | Admitting: Surgery

## 2021-02-12 VITALS — BP 100/70 | HR 70 | Wt 194.0 lb

## 2021-02-12 DIAGNOSIS — N83201 Unspecified ovarian cyst, right side: Secondary | ICD-10-CM

## 2021-02-12 DIAGNOSIS — N6091 Unspecified benign mammary dysplasia of right breast: Secondary | ICD-10-CM

## 2021-03-15 ENCOUNTER — Ambulatory Visit: Payer: Self-pay | Admitting: Surgery

## 2021-03-15 DIAGNOSIS — N6092 Unspecified benign mammary dysplasia of left breast: Secondary | ICD-10-CM

## 2021-03-15 NOTE — H&P (Signed)
Subjective    Chief Complaint: Follow-up       History of Present Illness: Tina [name] is a 65 y.o. female who is seen today for follow-up of breast mass..    This is a 65 year old female who is 18 years status post right breast lumpectomy for florid hyperplasia.  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  This area was unable to be localized and so she did not have excision.  She has been followed with MRI for the last several years.  MRI remains negative.  A recent screening mammogram showed a cluster of calcifications in the left upper outer quadrant.  She underwent further workup and biopsies showed "mucocele like lesion with calcifications".  She underwent left radioactive seed localized lumpectomy on 07/22/16 through a circumareolar incision.  The pathology showed fibrocystic changes with usual ductal hyperplasia and calcifications as well as PASH.  There is no evidence of malignancy.   She continues with follow-up with breast MRI every other year and annual mammograms.  Her most recent mammogram was in March of this year and was normal.  Her last MRI was in September 2020.  She does not have any new findings in either breast on self-examination.   The patient had a gynecologic procedure in September for thickened endometrium.  This procedure went well.   She underwent MRI on 01/17/2021 which showed an area of enhancement in the lower central left breast. This measures 1.7 x 0.9 cm.  She underwent biopsy of this area with clip placement.  Pathology revealed atypical ductal hyperplasia.  No mammographic correlate.  She presents now to discuss excision of this area.     Review of Systems: A complete review of systems was obtained from the patient.  I have reviewed this information and discussed as appropriate with the patient.  See HPI as well for other ROS.   Review of Systems  Constitutional: Negative.   HENT: Negative.   Eyes: Negative.   Respiratory: Negative.    Cardiovascular: Negative.   Gastrointestinal: Negative.   Genitourinary: Negative.   Musculoskeletal: Negative.   Skin: Negative.   Neurological: Negative.   Endo/Heme/Allergies: Negative.   Psychiatric/Behavioral: Negative.         Medical History: Past Medical History      Past Medical History:  Diagnosis Date   GERD (gastroesophageal reflux disease)     History of cancer     Hypertension             Patient Active Problem List  Diagnosis   Pseudoangiomatous stromal hyperplasia of breast   History of right breast cancer   HTN (hypertension)   Other and unspecified hyperlipidemia      Past Surgical History       Past Surgical History:  Procedure Laterality Date   CHOLECYSTECTOMY       MASTECTOMY PARTIAL / LUMPECTOMY Bilateral          Allergies       Allergies  Allergen Reactions   Dextromethorphan Other (See Comments)      "Mental fogginess"              Current Outpatient Medications on File Prior to Visit  Medication Sig Dispense Refill   losartan (COZAAR) 50 MG tablet         MIRVASO 0.33 % GlwP         omega-3 acid ethyl esters (LOVAZA) 1 gram capsule Take 2 g by mouth 2 (two)  times daily       omeprazole (PRILOSEC) 40 MG DR capsule          No current facility-administered medications on file prior to visit.      Family History       Family History  Problem Relation Age of Onset   Skin cancer Father     High blood pressure (Hypertension) Father     Hyperlipidemia (Elevated cholesterol) Father     Coronary Artery Disease (Blocked arteries around heart) Father     Diabetes Father          Social History       Tobacco Use  Smoking Status Never Smoker  Smokeless Tobacco Never Used      Social History  Social History         Socioeconomic History   Marital status: Widowed  Tobacco Use   Smoking status: Never Smoker   Smokeless tobacco: Never Used  Substance and Sexual Activity   Alcohol use: Yes      Comment: ONCE PER  MONTH   Drug use: Never        Objective:      There were no vitals filed for this visit.  There is no height or weight on file to calculate BMI.   Physical Exam    Well-developed well-nourished no apparent distress Breasts are symmetric, well-healed incisions. No nipple retraction or discharge. Bilateral fibrocystic changes with no dominant masses. No axillary lymphadenopathy on either side.   Labs, Imaging and Diagnostic Testing: Diagnosis Breast, left, needle core biopsy, lower, central, enhancement marked with barbell clip - ATYPICAL DUCTAL HYPERPLASIA. - FIBROCYSTIC CHANGE AND USUAL DUCTAL HYPERPLASIA. Microscopic Comment Dr. Saralyn Pilar has reviewed the case. The case was called to [facility] on 01/31/2021. Vicente Males MD Pathologist, Electronic Signature (Case signed 01/31/2021) Specimen Gross   CLINICAL DATA:  Screen. 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   LABS:  None obtained at the time of imaging.   EXAM: BILATERAL BREAST MRI WITH AND WITHOUT CONTRAST   TECHNIQUE: Multiplanar, multisequence MR images of both breasts were obtained prior to and following the intravenous administration of 9 ml of Gadavist   Three-dimensional MR images were rendered by post-processing of the original MR data on an independent workstation. The three-dimensional MR images were interpreted, and findings are reported in the following complete MRI report for this study. Three dimensional images were evaluated at the independent interpreting workstation using the DynaCAD thin client.   COMPARISON:  08/07/2020 mammogram and 01/11/2019 MRI   FINDINGS: Breast composition: c. Heterogeneous fibroglandular tissue.   Background parenchymal enhancement: Moderate   Right breast: No mass or abnormal enhancement.   Left breast: Within the LOWER central middle depth of the LEFT breast, there is T2 bright clumped nodular  enhancement showing plateau type enhancement kinetics. This area measures 1.7 x 0.9 centimeters on image 1 of series 6.   Lymph nodes: No abnormal appearing lymph nodes.   Ancillary findings: Stable LEFT hepatic cyst measuring 1.1 on image 49 of series 2.   IMPRESSION: Indeterminate clumped enhancement in the LOWER central LEFT breast warranting tissue diagnosis. Given the appearance, 9 sonographic correlate is unlikely.   Benign LEFT hepatic cyst.   RECOMMENDATION: Recommend MR guided core biopsy of the LEFT breast.   BI-RADS CATEGORY  4: Suspicious.     Electronically Signed   By: Nolon Nations M.D.   On: 01/18/2021 08:54   Assessment and Plan:  Diagnoses and all orders for this visit:   Atypical ductal hyperplasia of left breast     Left radioactive seed localized lumpectomy.The surgical procedure has been discussed with the patient.  Potential risks, benefits, alternative treatments, and expected outcomes have been explained.  All of the patient's questions at this time have been answered.  The likelihood of reaching the patient's treatment goal is good.  The patient understand the proposed surgical procedure and wishes to proceed.       Carlean Jews, MD    03/15/2021 11:41 AM

## 2021-03-20 ENCOUNTER — Other Ambulatory Visit: Payer: Self-pay | Admitting: Surgery

## 2021-03-20 DIAGNOSIS — N6092 Unspecified benign mammary dysplasia of left breast: Secondary | ICD-10-CM

## 2021-04-18 ENCOUNTER — Encounter (HOSPITAL_BASED_OUTPATIENT_CLINIC_OR_DEPARTMENT_OTHER): Payer: Self-pay | Admitting: Surgery

## 2021-04-18 ENCOUNTER — Other Ambulatory Visit: Payer: Self-pay

## 2021-04-18 NOTE — Progress Notes (Signed)

## 2021-04-24 ENCOUNTER — Ambulatory Visit
Admission: RE | Admit: 2021-04-24 | Discharge: 2021-04-24 | Disposition: A | Discharge status: Home / Self Care | Payer: Medicare Other | Source: Ambulatory Visit | Attending: Surgery | Admitting: Surgery

## 2021-04-24 DIAGNOSIS — N6092 Unspecified benign mammary dysplasia of left breast: Secondary | ICD-10-CM

## 2021-04-24 HISTORY — PX: BREAST BIOPSY: SHX20

## 2021-04-25 ENCOUNTER — Other Ambulatory Visit: Payer: Self-pay

## 2021-04-25 ENCOUNTER — Encounter (HOSPITAL_BASED_OUTPATIENT_CLINIC_OR_DEPARTMENT_OTHER): Payer: Self-pay | Admitting: Surgery

## 2021-04-25 ENCOUNTER — Ambulatory Visit (HOSPITAL_BASED_OUTPATIENT_CLINIC_OR_DEPARTMENT_OTHER): Payer: Medicare Other | Admitting: Anesthesiology

## 2021-04-25 ENCOUNTER — Ambulatory Visit (HOSPITAL_BASED_OUTPATIENT_CLINIC_OR_DEPARTMENT_OTHER)
Admission: RE | Admit: 2021-04-25 | Discharge: 2021-04-25 | Disposition: A | Discharge status: Home / Self Care | Payer: Medicare Other | Attending: Surgery | Admitting: Surgery

## 2021-04-25 ENCOUNTER — Encounter (HOSPITAL_BASED_OUTPATIENT_CLINIC_OR_DEPARTMENT_OTHER)
Admission: RE | Disposition: A | Discharge status: Home / Self Care | Payer: Self-pay | Source: Home / Self Care | Attending: Surgery

## 2021-04-25 ENCOUNTER — Ambulatory Visit
Admission: RE | Admit: 2021-04-25 | Discharge: 2021-04-25 | Disposition: A | Discharge status: Home / Self Care | Payer: Medicare Other | Source: Ambulatory Visit | Attending: Surgery | Admitting: Surgery

## 2021-04-25 DIAGNOSIS — N6092 Unspecified benign mammary dysplasia of left breast: Secondary | ICD-10-CM | POA: Diagnosis present

## 2021-04-25 DIAGNOSIS — D0512 Intraductal carcinoma in situ of left breast: Secondary | ICD-10-CM | POA: Diagnosis not present

## 2021-04-25 HISTORY — PX: BREAST LUMPECTOMY WITH RADIOACTIVE SEED LOCALIZATION: SHX6424

## 2021-04-25 HISTORY — PX: BREAST LUMPECTOMY: SHX2

## 2021-04-25 SURGERY — BREAST LUMPECTOMY WITH RADIOACTIVE SEED LOCALIZATION
Anesthesia: General | Site: Breast | Laterality: Left

## 2021-04-25 MED ORDER — MIDAZOLAM HCL 2 MG/2ML IJ SOLN
INTRAMUSCULAR | Status: AC
  Filled 2021-04-25: qty 2

## 2021-04-25 MED ORDER — DEXAMETHASONE SODIUM PHOSPHATE 10 MG/ML IJ SOLN
INTRAMUSCULAR | Status: AC
  Filled 2021-04-25: qty 1

## 2021-04-25 MED ORDER — LACTATED RINGERS IV SOLN: INTRAVENOUS | Status: DC

## 2021-04-25 MED ORDER — CEFAZOLIN SODIUM-DEXTROSE 2-4 GM/100ML-% IV SOLN: 2.0000 g | INTRAVENOUS | Status: DC

## 2021-04-25 MED ORDER — LIDOCAINE 2% (20 MG/ML) 5 ML SYRINGE
INTRAMUSCULAR | Status: AC
  Filled 2021-04-25: qty 5

## 2021-04-25 MED ORDER — PHENYLEPHRINE HCL (PRESSORS) 10 MG/ML IV SOLN
INTRAVENOUS | Status: DC | PRN
  Administered 2021-04-25: 200 ug via INTRAVENOUS

## 2021-04-25 MED ORDER — FENTANYL CITRATE (PF) 100 MCG/2ML IJ SOLN
INTRAMUSCULAR | Status: DC | PRN
  Administered 2021-04-25: 100 ug via INTRAVENOUS

## 2021-04-25 MED ORDER — ONDANSETRON HCL 4 MG/2ML IJ SOLN
INTRAMUSCULAR | Status: DC | PRN
  Administered 2021-04-25: 4 mg via INTRAVENOUS

## 2021-04-25 MED ORDER — ACETAMINOPHEN 500 MG PO TABS
ORAL_TABLET | ORAL | Status: AC
  Filled 2021-04-25: qty 2

## 2021-04-25 MED ORDER — LACTATED RINGERS IV SOLN: INTRAVENOUS | Status: DC | PRN

## 2021-04-25 MED ORDER — DEXAMETHASONE SODIUM PHOSPHATE 10 MG/ML IJ SOLN
INTRAMUSCULAR | Status: DC | PRN
  Administered 2021-04-25: 5 mg via INTRAVENOUS

## 2021-04-25 MED ORDER — PROPOFOL 10 MG/ML IV BOLUS
INTRAVENOUS | Status: DC | PRN
  Administered 2021-04-25: 140 mg via INTRAVENOUS

## 2021-04-25 MED ORDER — LIDOCAINE HCL (CARDIAC) PF 100 MG/5ML IV SOSY
PREFILLED_SYRINGE | INTRAVENOUS | Status: DC | PRN
  Administered 2021-04-25: 100 mg via INTRAVENOUS

## 2021-04-25 MED ORDER — ONDANSETRON HCL 4 MG/2ML IJ SOLN
INTRAMUSCULAR | Status: AC
  Filled 2021-04-25: qty 2

## 2021-04-25 MED ORDER — CHLORHEXIDINE GLUCONATE CLOTH 2 % EX PADS: 6.0000 | MEDICATED_PAD | Freq: Once | CUTANEOUS | Status: DC

## 2021-04-25 MED ORDER — MIDAZOLAM HCL 2 MG/2ML IJ SOLN
INTRAMUSCULAR | Status: DC | PRN
  Administered 2021-04-25: 2 mg via INTRAVENOUS

## 2021-04-25 MED ORDER — ACETAMINOPHEN 500 MG PO TABS
1000.0000 mg | ORAL_TABLET | ORAL | Status: AC
  Administered 2021-04-25: 1000 mg via ORAL

## 2021-04-25 MED ORDER — CEFAZOLIN SODIUM-DEXTROSE 2-3 GM-%(50ML) IV SOLR
INTRAVENOUS | Status: DC | PRN
  Administered 2021-04-25: 2 g via INTRAVENOUS

## 2021-04-25 MED ORDER — FENTANYL CITRATE (PF) 100 MCG/2ML IJ SOLN
INTRAMUSCULAR | Status: AC
  Filled 2021-04-25: qty 2

## 2021-04-25 MED ORDER — PROPOFOL 10 MG/ML IV BOLUS
INTRAVENOUS | Status: AC
  Filled 2021-04-25: qty 20

## 2021-04-25 MED ORDER — CEFAZOLIN SODIUM-DEXTROSE 2-4 GM/100ML-% IV SOLN
INTRAVENOUS | Status: AC
  Filled 2021-04-25: qty 100

## 2021-04-25 MED ORDER — BUPIVACAINE-EPINEPHRINE 0.25% -1:200000 IJ SOLN
INTRAMUSCULAR | Status: DC | PRN
  Administered 2021-04-25: 10 mL

## 2021-04-25 MED ORDER — FENTANYL CITRATE (PF) 100 MCG/2ML IJ SOLN: 25.0000 ug | INTRAMUSCULAR | Status: DC | PRN

## 2021-04-25 SURGICAL SUPPLY — 53 items
APL PRP STRL LF DISP 70% ISPRP (MISCELLANEOUS) ×1
APL SKNCLS STERI-STRIP NONHPOA (GAUZE/BANDAGES/DRESSINGS) ×1
APPLIER CLIP 9.375 MED OPEN (MISCELLANEOUS) ×3
APR CLP MED 9.3 20 MLT OPN (MISCELLANEOUS) ×1
BENZOIN TINCTURE PRP APPL 2/3 (GAUZE/BANDAGES/DRESSINGS) ×3 IMPLANT
BINDER BREAST LRG (GAUZE/BANDAGES/DRESSINGS) ×2 IMPLANT
BLADE HEX COATED 2.75 (ELECTRODE) ×3 IMPLANT
BLADE SURG 15 STRL LF DISP TIS (BLADE) ×1 IMPLANT
BLADE SURG 15 STRL SS (BLADE) ×3
CANISTER SUC SOCK COL 7IN (MISCELLANEOUS) IMPLANT
CANISTER SUCT 1200ML W/VALVE (MISCELLANEOUS) IMPLANT
CHLORAPREP W/TINT 26 (MISCELLANEOUS) ×3 IMPLANT
CLIP APPLIE 9.375 MED OPEN (MISCELLANEOUS) ×1 IMPLANT
CLOSURE WOUND 1/2 X4 (GAUZE/BANDAGES/DRESSINGS) ×1
COVER BACK TABLE 60X90IN (DRAPES) ×3 IMPLANT
COVER MAYO STAND STRL (DRAPES) ×3 IMPLANT
COVER PROBE W GEL 5X96 (DRAPES) ×3 IMPLANT
DECANTER SPIKE VIAL GLASS SM (MISCELLANEOUS) IMPLANT
DRAPE LAPAROTOMY 100X72 PEDS (DRAPES) ×3 IMPLANT
DRAPE UTILITY XL STRL (DRAPES) ×3 IMPLANT
DRSG TEGADERM 4X4.75 (GAUZE/BANDAGES/DRESSINGS) ×3 IMPLANT
ELECT REM PT RETURN 9FT ADLT (ELECTROSURGICAL) ×3
ELECTRODE REM PT RTRN 9FT ADLT (ELECTROSURGICAL) ×1 IMPLANT
GAUZE SPONGE 4X4 12PLY STRL LF (GAUZE/BANDAGES/DRESSINGS) IMPLANT
GLOVE SURG ENC MOIS LTX SZ7 (GLOVE) ×3 IMPLANT
GLOVE SURG UNDER POLY LF SZ7.5 (GLOVE) ×3 IMPLANT
GOWN STRL REUS W/ TWL LRG LVL3 (GOWN DISPOSABLE) ×2 IMPLANT
GOWN STRL REUS W/TWL LRG LVL3 (GOWN DISPOSABLE) ×6
ILLUMINATOR WAVEGUIDE N/F (MISCELLANEOUS) IMPLANT
KIT MARKER MARGIN INK (KITS) ×3 IMPLANT
LIGHT WAVEGUIDE WIDE FLAT (MISCELLANEOUS) IMPLANT
NDL HYPO 25X1 1.5 SAFETY (NEEDLE) ×1 IMPLANT
NEEDLE HYPO 25X1 1.5 SAFETY (NEEDLE) ×3 IMPLANT
NS IRRIG 1000ML POUR BTL (IV SOLUTION) ×3 IMPLANT
PACK BASIN DAY SURGERY FS (CUSTOM PROCEDURE TRAY) ×3 IMPLANT
PENCIL SMOKE EVACUATOR (MISCELLANEOUS) ×3 IMPLANT
SLEEVE SCD COMPRESS KNEE MED (STOCKING) ×3 IMPLANT
SPONGE GAUZE 2X2 8PLY STER LF (GAUZE/BANDAGES/DRESSINGS)
SPONGE GAUZE 2X2 8PLY STRL LF (GAUZE/BANDAGES/DRESSINGS) IMPLANT
SPONGE T-LAP 18X18 ~~LOC~~+RFID (SPONGE) IMPLANT
SPONGE T-LAP 4X18 ~~LOC~~+RFID (SPONGE) ×3 IMPLANT
STRIP CLOSURE SKIN 1/2X4 (GAUZE/BANDAGES/DRESSINGS) ×2 IMPLANT
SUT MON AB 4-0 PC3 18 (SUTURE) ×3 IMPLANT
SUT SILK 2 0 SH (SUTURE) IMPLANT
SUT VIC AB 3-0 SH 27 (SUTURE) ×3
SUT VIC AB 3-0 SH 27X BRD (SUTURE) ×1 IMPLANT
SYR BULB EAR ULCER 3OZ GRN STR (SYRINGE) IMPLANT
SYR CONTROL 10ML LL (SYRINGE) ×3 IMPLANT
TOWEL GREEN STERILE FF (TOWEL DISPOSABLE) ×3 IMPLANT
TRAY FAXITRON CT DISP (TRAY / TRAY PROCEDURE) ×3 IMPLANT
TUBE CONNECTING 20'X1/4 (TUBING)
TUBE CONNECTING 20X1/4 (TUBING) IMPLANT
YANKAUER SUCT BULB TIP NO VENT (SUCTIONS) IMPLANT

## 2021-04-25 NOTE — Op Note (Signed)
Pre-op Diagnosis:  Atypical ductal hyperplasia - left breast Post-op Diagnosis: same Procedure:  Left radioactive seed localized lumpectomy Surgeon:  Meela Wareing K. Anesthesia:  GEN - LMA Indications:  This is a 65 year old female who is 18 years status post right breast lumpectomy for florid hyperplasia.  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  This area was unable to be localized and so she did not have excision.  She has been followed with MRI for the last several years.  MRI remains negative.  A recent screening mammogram showed a cluster of calcifications in the left upper outer quadrant.  She underwent further workup and biopsies showed "mucocele like lesion with calcifications".  She underwent left radioactive seed localized lumpectomy on 07/22/16 through a circumareolar incision.  The pathology showed fibrocystic changes with usual ductal hyperplasia and calcifications as well as PASH.  There is no evidence of malignancy.    She underwent MRI on 01/17/2021 which showed an area of enhancement in the lower central left breast. This measures 1.7 x 0.9 cm.  She underwent biopsy of this area with clip placement.  Pathology revealed atypical ductal hyperplasia.  No mammographic correlate.  She presents now to discuss excision of this area.      Description of procedure: The patient is brought to the operating room placed in supine position on the operating room table. After an adequate level of general anesthesia was obtained, her left breast was prepped with ChloraPrep and draped in sterile fashion. A timeout was taken to ensure the proper patient and proper procedure. We interrogated the breast with the neoprobe. We made a transverse incision in the lower breast after infiltrating with 0.25% Marcaine. Dissection was carried down in the breast tissue with cautery. We used the neoprobe to guide [place] towards the radioactive seed. We excised an area of tissue around the radioactive  seed 2 cm in diameter. The specimen was removed and was oriented with a paint kit. Specimen mammogram showed the radioactive seed as well as the biopsy clip within the specimen. This was sent for pathologic examination. There is no residual radioactivity within the biopsy cavity. We inspected carefully for hemostasis. The wound was thoroughly irrigated. The wound was closed with a deep layer of 3-0 Vicryl and a subcuticular layer of 4-0 Monocryl. Benzoin Steri-Strips were applied. The patient was then extubated and brought to the recovery room in stable condition. All sponge, instrument, and needle counts are correct.  Imogene Burn. Georgette Dover, MD, [facility] Surgery  General/ Trauma Surgery  04/25/2021 2:19 PM

## 2021-04-25 NOTE — Transfer of Care (Signed)
Immediate Anesthesia Transfer of Care Note  Patient: Tina [name]  Procedure(s) Performed: LEFT BREAST LUMPECTOMY WITH RADIOACTIVE SEED LOCALIZATION (Left: Breast)  Patient Location: PACU  Anesthesia Type:General  Level of Consciousness: awake, alert  and oriented  Airway & Oxygen Therapy: Patient Spontanous Breathing and Patient connected to face mask oxygen  Post-op Assessment: Report given to RN and Post -op Vital signs reviewed and stable  Post vital signs: Reviewed and stable  Last Vitals:  Vitals Value Taken Time  BP    Temp    Pulse    Resp    SpO2      Last Pain:  Vitals:   04/25/21 1146  TempSrc: Oral  PainSc: 0-No pain      Patients Stated Pain Goal: 6 (14/48/18 5631)  Complications: No notable events documented.

## 2021-04-25 NOTE — Anesthesia Procedure Notes (Signed)
Procedure Name: LMA Insertion Date/Time: 04/25/2021 2:26 PM Performed by: Verita Lamb, CRNA Pre-anesthesia Checklist: Patient identified, Emergency Drugs available, Suction available and Patient being monitored Patient Re-evaluated:Patient Re-evaluated prior to induction Oxygen Delivery Method: Circle system utilized Preoxygenation: Pre-oxygenation with 100% oxygen Induction Type: IV induction Ventilation: Mask ventilation without difficulty LMA: LMA inserted LMA Size: 4.0 Number of attempts: 1 Airway Equipment and Method: Bite block Placement Confirmation: positive ETCO2 Tube secured with: Tape Dental Injury: Teeth and Oropharynx as per pre-operative assessment

## 2021-04-25 NOTE — Discharge Instructions (Addendum)
[facility] Office Phone Number [phone]  BREAST BIOPSY/ PARTIAL MASTECTOMY: POST OP INSTRUCTIONS  Always review your discharge instruction sheet given to you by the facility where your surgery was performed.  IF YOU HAVE DISABILITY OR FAMILY LEAVE FORMS, YOU MUST BRING THEM TO THE OFFICE FOR PROCESSING.  DO NOT GIVE THEM TO YOUR DOCTOR.  A prescription for pain medication may be given to you upon discharge.  Take your pain medication as prescribed, if needed.  If narcotic pain medicine is not needed, then you may take acetaminophen (Tylenol) or ibuprofen (Advil) as needed. Take your usually prescribed medications unless otherwise directed If you need a refill on your pain medication, please contact your pharmacy.  They will contact our office to request authorization.  Prescriptions will not be filled after 5pm or on week-ends. You should eat very light the first 24 hours after surgery, such as soup, crackers, pudding, etc.  Resume your normal diet the day after surgery. Most patients will experience some swelling and bruising in the breast.  Ice packs and a good support bra will help.  Swelling and bruising can take several days to resolve.  It is common to experience some constipation if taking pain medication after surgery.  Increasing fluid intake and taking a stool softener will usually help or prevent this problem from occurring.  A mild laxative (Milk of Magnesia or Miralax) should be taken according to package directions if there are no bowel movements after 48 hours. Unless discharge instructions indicate otherwise, you may remove your bandages 24-48 hours after surgery, and you may shower at that time.  You may have steri-strips (small skin tapes) in place directly over the incision.  These strips should be left on the skin for 7-10 days.  If your surgeon used skin glue on the incision, you may shower in 24 hours.  The glue will flake off over the next 2-3 weeks.  Any  sutures or staples will be removed at the office during your follow-up visit. ACTIVITIES:  You may resume regular daily activities (gradually increasing) beginning the next day.  Wearing a good support bra or sports bra minimizes pain and swelling.  You may have sexual intercourse when it is comfortable. You may drive when you no longer are taking prescription pain medication, you can comfortably wear a seatbelt, and you can safely maneuver your car and apply brakes. RETURN TO WORK:  ______________________________________________________________________________________ Dennis Bast should see your doctor in the office for a follow-up appointment approximately two weeks after your surgery.  Your doctors nurse will typically make your follow-up appointment when she calls you with your pathology report.  Expect your pathology report 2-3 business days after your surgery.  You may call to check if you do not hear from [place] after three days. OTHER INSTRUCTIONS: _______________________________________________________________________________________________ _____________________________________________________________________________________________________________________________________ _____________________________________________________________________________________________________________________________________ _____________________________________________________________________________________________________________________________________  WHEN TO CALL YOUR DOCTOR: Fever over 101.0 Nausea and/or vomiting. Extreme swelling or bruising. Continued bleeding from incision. Increased pain, redness, or drainage from the incision.  The clinic staff is available to answer your questions during regular business hours.  Please dont hesitate to call and ask to speak to one of the nurses for clinical concerns.  If you have a medical emergency, go to the nearest emergency room or call 911.  A surgeon from [facility] is always on call at the hospital.  For further questions, please visit [email or web address]    No Tylenol until after 6pm today if needed  Post Anesthesia Home Care Instructions  Activity: Get plenty of rest for the remainder of the day. A responsible individual must stay with you for 24 hours following the procedure.  For the next 24 hours, DO NOT: -Drive a car -Paediatric nurse -Drink alcoholic beverages -Take any medication unless instructed by your physician -Make any legal decisions or sign important papers.  Meals: Start with liquid foods such as gelatin or soup. Progress to regular foods as tolerated. Avoid greasy, spicy, heavy foods. If nausea and/or vomiting occur, drink only clear liquids until the nausea and/or vomiting subsides. Call your physician if vomiting continues.  Special Instructions/Symptoms: Your throat may feel dry or sore from the anesthesia or the breathing tube placed in your throat during surgery. If this causes discomfort, gargle with warm salt water. The discomfort should disappear within 24 hours.  If you had a scopolamine patch placed behind your ear for the management of post- operative nausea and/or vomiting:  1. The medication in the patch is effective for 72 hours, after which it should be removed.  Wrap patch in a tissue and discard in the trash. Wash hands thoroughly with soap and water. 2. You may remove the patch earlier than 72 hours if you experience unpleasant side effects which may include dry mouth, dizziness or visual disturbances. 3. Avoid touching the patch. Wash your hands with soap and water after contact with the patch.

## 2021-04-25 NOTE — H&P (Signed)
Subjective    Chief Complaint: Follow-up       History of Present Illness: Tina [name] is a 65 y.o. female who is seen today for follow-up of breast mass..    This is a 65 year old female who is 18 years status post right breast lumpectomy for florid hyperplasia.  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  This area was unable to be localized and so she did not have excision.  She has been followed with MRI for the last several years.  MRI remains negative.  A recent screening mammogram showed a cluster of calcifications in the left upper outer quadrant.  She underwent further workup and biopsies showed "mucocele like lesion with calcifications".  She underwent left radioactive seed localized lumpectomy on 07/22/16 through a circumareolar incision.  The pathology showed fibrocystic changes with usual ductal hyperplasia and calcifications as well as PASH.  There is no evidence of malignancy.   She continues with follow-up with breast MRI every other year and annual mammograms.  Her most recent mammogram was in March of this year and was normal.  Her last MRI was in September 2020.  She does not have any new findings in either breast on self-examination.   The patient had a gynecologic procedure in September for thickened endometrium.  This procedure went well.   She underwent MRI on 01/17/2021 which showed an area of enhancement in the lower central left breast. This measures 1.7 x 0.9 cm.  She underwent biopsy of this area with clip placement.  Pathology revealed atypical ductal hyperplasia.  No mammographic correlate.  She presents now to discuss excision of this area.     Review of Systems: A complete review of systems was obtained from the patient.  I have reviewed this information and discussed as appropriate with the patient.  See HPI as well for other ROS.   Review of Systems  Constitutional: Negative.   HENT: Negative.   Eyes: Negative.   Respiratory: Negative.    Cardiovascular: Negative.   Gastrointestinal: Negative.   Genitourinary: Negative.   Musculoskeletal: Negative.   Skin: Negative.   Neurological: Negative.   Endo/Heme/Allergies: Negative.   Psychiatric/Behavioral: Negative.         Medical History: Past Medical History         Past Medical History:  Diagnosis Date   GERD (gastroesophageal reflux disease)     History of cancer     Hypertension               Patient Active Problem List  Diagnosis   Pseudoangiomatous stromal hyperplasia of breast   History of right breast cancer   HTN (hypertension)   Other and unspecified hyperlipidemia      Past Surgical History           Past Surgical History:  Procedure Laterality Date   CHOLECYSTECTOMY       MASTECTOMY PARTIAL / LUMPECTOMY Bilateral          Allergies           Allergies  Allergen Reactions   Dextromethorphan Other (See Comments)      "Mental fogginess"                   Current Outpatient Medications on File Prior to Visit  Medication Sig Dispense Refill   losartan (COZAAR) 50 MG tablet         MIRVASO 0.33 % GlwP  omega-3 acid ethyl esters (LOVAZA) 1 gram capsule Take 2 g by mouth 2 (two) times daily       omeprazole (PRILOSEC) 40 MG DR capsule          No current facility-administered medications on file prior to visit.      Family History           Family History  Problem Relation Age of Onset   Skin cancer Father     High blood pressure (Hypertension) Father     Hyperlipidemia (Elevated cholesterol) Father     Coronary Artery Disease (Blocked arteries around heart) Father     Diabetes Father          Social History         Tobacco Use  Smoking Status Never Smoker  Smokeless Tobacco Never Used      Social History  Social History             Socioeconomic History   Marital status: Widowed  Tobacco Use   Smoking status: Never Smoker   Smokeless tobacco: Never Used  Substance and Sexual Activity   Alcohol use: Yes       Comment: ONCE PER MONTH   Drug use: Never        Objective:      There were no vitals filed for this visit.  There is no height or weight on file to calculate BMI.   Physical Exam    Well-developed well-nourished no apparent distress Breasts are symmetric, well-healed incisions. No nipple retraction or discharge. Bilateral fibrocystic changes with no dominant masses. No axillary lymphadenopathy on either side.   Labs, Imaging and Diagnostic Testing: Diagnosis Breast, left, needle core biopsy, lower, central, enhancement marked with barbell clip - ATYPICAL DUCTAL HYPERPLASIA. - FIBROCYSTIC CHANGE AND USUAL DUCTAL HYPERPLASIA. Microscopic Comment Dr. Saralyn Pilar has reviewed the case. The case was called to [facility] on 01/31/2021. Vicente Males MD Pathologist, Electronic Signature (Case signed 01/31/2021) Specimen Gross   CLINICAL DATA:  Screen. 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   LABS:  None obtained at the time of imaging.   EXAM: BILATERAL BREAST MRI WITH AND WITHOUT CONTRAST   TECHNIQUE: Multiplanar, multisequence MR images of both breasts were obtained prior to and following the intravenous administration of 9 ml of Gadavist   Three-dimensional MR images were rendered by post-processing of the original MR data on an independent workstation. The three-dimensional MR images were interpreted, and findings are reported in the following complete MRI report for this study. Three dimensional images were evaluated at the independent interpreting workstation using the DynaCAD thin client.   COMPARISON:  08/07/2020 mammogram and 01/11/2019 MRI   FINDINGS: Breast composition: c. Heterogeneous fibroglandular tissue.   Background parenchymal enhancement: Moderate   Right breast: No mass or abnormal enhancement.   Left breast: Within the LOWER central middle depth of the LEFT breast, there is T2 bright  clumped nodular enhancement showing plateau type enhancement kinetics. This area measures 1.7 x 0.9 centimeters on image 1 of series 6.   Lymph nodes: No abnormal appearing lymph nodes.   Ancillary findings: Stable LEFT hepatic cyst measuring 1.1 on image 49 of series 2.   IMPRESSION: Indeterminate clumped enhancement in the LOWER central LEFT breast warranting tissue diagnosis. Given the appearance, 9 sonographic correlate is unlikely.   Benign LEFT hepatic cyst.   RECOMMENDATION: Recommend MR guided core biopsy of the LEFT breast.   BI-RADS CATEGORY  4:  Suspicious.     Electronically Signed   By: Nolon Nations M.D.   On: 01/18/2021 08:54   Assessment and Plan:  Diagnoses and all orders for this visit:   Atypical ductal hyperplasia of left breast     Left radioactive seed localized lumpectomy.The surgical procedure has been discussed with the patient.  Potential risks, benefits, alternative treatments, and expected outcomes have been explained.  All of the patient's questions at this time have been answered.  The likelihood of reaching the patient's treatment goal is good.  The patient understand the proposed surgical procedure and wishes to proceed.    Imogene Burn. Georgette Dover, MD, [facility]  General Surgery   04/25/2021 1:21 PM

## 2021-04-25 NOTE — Anesthesia Preprocedure Evaluation (Addendum)
Anesthesia Evaluation  Patient identified by MRN, date of birth, ID band Patient awake    Reviewed: Allergy & Precautions, H&P , NPO status , Patient's Chart, lab work & pertinent test results  Airway Mallampati: II  TM Distance: >3 FB Neck ROM: Full    Dental no notable dental hx. (+) Teeth Intact, Dental Advisory Given   Pulmonary neg pulmonary ROS,    Pulmonary exam normal breath sounds clear to auscultation       Cardiovascular hypertension, Pt. on medications  Rhythm:Regular Rate:Normal     Neuro/Psych negative neurological ROS  negative psych ROS   GI/Hepatic Neg liver ROS, GERD  Medicated,  Endo/Other  negative endocrine ROS  Renal/GU negative Renal ROS  negative genitourinary   Musculoskeletal   Abdominal   Peds  Hematology negative hematology ROS (+)   Anesthesia Other Findings   Reproductive/Obstetrics negative OB ROS                            Anesthesia Physical Anesthesia Plan  ASA: 2  Anesthesia Plan: General   Post-op Pain Management: Tylenol PO (pre-op)   Induction: Intravenous  PONV Risk Score and Plan: 4 or greater and Ondansetron, Dexamethasone and Midazolam  Airway Management Planned: LMA  Additional Equipment:   Intra-op Plan:   Post-operative Plan: Extubation in OR  Informed Consent: I have reviewed the patients History and Physical, chart, labs and discussed the procedure including the risks, benefits and alternatives for the proposed anesthesia with the patient or authorized representative who has indicated his/her understanding and acceptance.     Dental advisory given  Plan Discussed with: CRNA  Anesthesia Plan Comments:         Anesthesia Quick Evaluation

## 2021-04-25 NOTE — Anesthesia Postprocedure Evaluation (Signed)
Anesthesia Post Note  Patient: Tina [name]  Procedure(s) Performed: LEFT BREAST LUMPECTOMY WITH RADIOACTIVE SEED LOCALIZATION (Left: Breast)     Patient location during evaluation: PACU Anesthesia Type: General Level of consciousness: awake and alert Pain management: pain level controlled Vital Signs Assessment: post-procedure vital signs reviewed and stable Respiratory status: spontaneous breathing, nonlabored ventilation and respiratory function stable Cardiovascular status: blood pressure returned to baseline and stable Postop Assessment: no apparent nausea or vomiting Anesthetic complications: no   No notable events documented.  Last Vitals:  Vitals:   04/25/21 1430 04/25/21 1501  BP: 123/62 127/77  Pulse: 80 71  Resp: 15 16  Temp:  36.6 C  SpO2: 100% 100%    Last Pain:  Vitals:   04/25/21 1501  TempSrc:   PainSc: 0-No pain                 Aslin Farinas,W. EDMOND

## 2021-04-29 ENCOUNTER — Encounter (HOSPITAL_BASED_OUTPATIENT_CLINIC_OR_DEPARTMENT_OTHER): Payer: Self-pay | Admitting: Surgery

## 2021-05-01 LAB — SURGICAL PATHOLOGY

## 2021-05-08 ENCOUNTER — Telehealth: Payer: Self-pay | Admitting: Hematology

## 2021-05-08 NOTE — Telephone Encounter (Signed)
Scheduled appt per 12/22 referral. Pt is aware of appt date and time. Pt is aware to arrive 15 mins early to appt.

## 2021-05-20 NOTE — Progress Notes (Signed)
Radiation Oncology         [phone] ________________________________  Initial Outpatient Consultation  Name: Tina [name] MRN: [id number]  Date: 05/21/2021  DOB: Apr 14, 1956  OI:NOMV, Emily Filbert., MD  Donnie Mesa, MD   REFERRING PHYSICIAN: Donnie Mesa, MD  DIAGNOSIS:    ICD-10-CM   1. Ductal carcinoma in situ (DCIS) of left breast  D05.12      Stage 0 Left Breast, Intermediate grade ductal carcinoma in-situ, ER+ / PR+ / Her2 not assessed  CHIEF COMPLAINT: Here to discuss management of left breast DCIS  HISTORY OF PRESENT ILLNESS::Tina [name] is a 66 y.o. female who is 18 years status post right breast lumpectomy for florid hyperplasia.  At that time, she was also found to have LCIS of the left breast.  This area was followed with MRI in the coming years which remained negative. However, the patient presented for a routine screening mammogram on 06/29/2016 which revealed a cluster of calcifications in the UOQ of the left breast.  Biopsy performed on 06/30/2016 showed "mucocele like lesion with calcifications". She underwent left radioactive seed localized lumpectomy on 07/22/16 through a circumareolar incision.  The pathology showed fibrocystic changes with usual ductal hyperplasia and calcifications as well as PASH, and no evidence of malignancy.   The patient continued to receive follow up MRI's which were all negative. However, an MRI performed on 01/17/21 showed an area of enhancement in the lower central left breast, measuring 1.7 x 0.9 cm. No symptoms, if any, were reported at that time.     Biopsy of the lower left breast enhancement on the date of 01/30/21 showed atypical ductal hyperplasia and fibrocystic changes with usual ductal hyperplasia.    Bilateral diagnostic mammogram on 02/12/21 showed the biopsy-proven site of ADH in the central inferior left breast. No other new or suspicious findings were appreciated in either breast.  Subsequently, the  patient was referred back to Dr. Georgette Dover on 03/15/21 to discuss excision of the area of ADH. Following discussion of the risks and benefits, the patient agreed to proceed with left breast lumpectomy.  Left breast lumpectomy performed by Dr. Georgette Dover on 04/25/21 revealed: DCIS the size of 0.6 cm;  histology of intermediate grade ductal carcinoma in-situ focally involving a papillary lesion, with mild fibrocystic change, usual ductal hyperplasia and apocrine metaplasia ; margin status to in-situ disease of less than 1 mm to the inferior margin, and all margins negative for DCIS; no lymph nodes were examined.   ER status 100% positive, PR status 100% positive, both with strong staining intensity; Her2 not assessed.  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  PREVIOUS RADIATION THERAPY: No  PAST MEDICAL HISTORY:  has a past medical history of Acid reflux, Cancer ([place]) (2002), HTN (hypertension), Hyperlipidemia, Hypertension, Osteopenia (07/2011), Rosacea, and Schatzki's ring.    PAST SURGICAL HISTORY: Past Surgical History:  Procedure Laterality Date   BREAST BIOPSY Left    Stereo High Risk    BREAST EXCISIONAL BIOPSY Right    BREAST EXCISIONAL BIOPSY Left 2018   BREAST LUMPECTOMY WITH RADIOACTIVE SEED LOCALIZATION Left 07/22/2016   Procedure: LEFT BREAST LUMPECTOMY WITH RADIOACTIVE SEED LOCALIZATION;  Surgeon: Donnie Mesa, MD;  Location: [facility];  Service: General;  Laterality: Left;   BREAST LUMPECTOMY WITH RADIOACTIVE SEED LOCALIZATION Left 04/25/2021   Procedure: LEFT BREAST LUMPECTOMY WITH RADIOACTIVE SEED LOCALIZATION;  Surgeon: Donnie Mesa, MD;  Location: [facility];  Service: General;  Laterality: Left;   BREAST SURGERY  2004   excisional right breast surgery    CHOLECYSTECTOMY  2003   [place] N/A 01/22/2021   Procedure: DILATATION & CURETTAGE/HYSTEROSCOPY WITH MYOSURE RESECTION OF ENDOMETERIAL POLYP;   Surgeon: Nunzio Cobbs, MD;  Location: [facility];  Service: Gynecology;  Laterality: N/A;    FAMILY HISTORY: family history includes Asthma in her son; Cancer in her father, maternal uncle, and paternal uncle; Cancer (age of onset: 76) in her mother; Dementia in her mother; Diabetes in her brother and father; Diabetes (age of onset: 37) in her son; Heart disease in her father; Hypertension in her father; Osteoporosis in her mother; Stroke (age of onset: 67) in her father.  SOCIAL HISTORY:  reports that she has never smoked. She has never used smokeless tobacco. She reports current alcohol use. She reports that she does not use drugs.  ALLERGIES: Dextromethorphan  MEDICATIONS:  Current Outpatient Medications  Medication Sig Dispense Refill   acetaminophen (TYLENOL) 325 MG tablet Take 650 mg by mouth every 6 (six) hours as needed (for pain/headaches.).     anastrozole (ARIMIDEX) 1 MG tablet Take 1 tablet (1 mg total) by mouth daily. 90 tablet 1   Calcium Carb-Cholecalciferol 600-800 MG-UNIT TABS Take 1 tablet by mouth at bedtime.     cholecalciferol (VITAMIN D) 1000 units tablet Take 1,000 Units by mouth at bedtime.     losartan (COZAAR) 100 MG tablet Take 1 tablet by mouth daily.     LOVAZA 1 G capsule Take 2 g by mouth 2 (two) times daily.      MIRVASO 0.33 % GEL Apply 1 application topically daily. Applied to face daily in the morning.     Multiple Vitamin (MULTIVITAMIN) capsule Take 1 capsule by mouth daily.     XIIDRA 5 % SOLN      No current facility-administered medications for this encounter.    REVIEW OF SYSTEMS: As above in HPI.   PHYSICAL EXAM:  height is 5' 7.5" (1.715 m) and weight is 185 lb 6 oz (84.1 kg). Her temporal temperature is 97 F (36.1 C) (abnormal). Her blood pressure is 140/79 and her pulse is 80. Her respiration is 18 and oxygen saturation is 100%.   General: Alert and oriented, in no acute distress HEENT: Head is normocephalic.   Heart: Regular in rate and rhythm with no murmurs, rubs, or gallops. Chest: Clear to auscultation bilaterally, with no rhonchi, wheezes, or rales. Skin: No concerning lesions. Musculoskeletal: ambulatory Neurologic:   No obvious focalities. Speech is fluent. Coordination is intact. Psychiatric: Judgment and insight are intact. Affect is appropriate. Breasts: satisfactory healing, left breast lumpectomy   ECOG = 0  0 - Asymptomatic (Fully active, able to carry on all predisease activities without restriction)  1 - Symptomatic but completely ambulatory (Restricted in physically strenuous activity but ambulatory and able to carry out work of a light or sedentary nature. For example, light housework, office work)  2 - Symptomatic, <50% in bed during the day (Ambulatory and capable of all self care but unable to carry out any work activities. Up and about more than 50% of waking hours)  3 - Symptomatic, >50% in bed, but not bedbound (Capable of only limited self-care, confined to bed or chair 50% or more of waking hours)  4 - Bedbound (Completely disabled. Cannot carry on any self-care. Totally confined to bed or chair)  5 - Death   Lolita Rieger, Daniel Nones  RH, Tormey DC, et al. [phone]). "Toxicity and response criteria of the [facility] Group". [place] Oncol. 5 (6): 649-55   LABORATORY DATA:  Lab Results  Component Value Date   WBC 5.4 01/22/2021   HGB 13.8 01/22/2021   HCT 40.3 01/22/2021   MCV 89.8 01/22/2021   PLT 254 01/22/2021   CMP     Component Value Date/Time   NA 141 01/22/2021 0945   K 4.1 01/22/2021 0945   CL 107 01/22/2021 0945   CO2 26 01/22/2021 0945   GLUCOSE 104 (H) 01/22/2021 0945   BUN 13 01/22/2021 0945   CREATININE 0.77 01/22/2021 0945   CREATININE 0.82 08/04/2011 1125   CALCIUM 9.3 01/22/2021 0945   PROT 7.5 08/04/2011 1125   ALBUMIN 4.7 08/04/2011 1125   AST 19 08/04/2011 1125   ALT 16 08/04/2011 1125   ALKPHOS 63 08/04/2011 1125    BILITOT 0.4 08/04/2011 1125   GFRNONAA >60 01/22/2021 0945   GFRAA >60 12/16/2016 2231         RADIOGRAPHY: MM Breast Surgical Specimen  Result Date: 04/25/2021 CLINICAL DATA:  Surgical specimen mammogram. Excision of a left breast lesion following radioactive seed localization. EXAM: SPECIMEN RADIOGRAPH OF THE LEFT BREAST COMPARISON:  Previous exam(s). FINDINGS: Status post excision of the left breast. The radioactive seed and biopsy marker clip are present, completely intact, and were marked for pathology. IMPRESSION: Specimen radiograph of the left breast. Electronically Signed   By: Lajean Manes M.D.   On: 04/25/2021 14:15  MM LT RADIOACTIVE SEED LOC MAMMO GUIDE  Result Date: 04/24/2021 CLINICAL DATA:  65 year old with biopsy-proven atypical ductal hyperplasia involving the lower LEFT breast at middle to posterior depth, near 6 o'clock location, identified on recent MRI core needle biopsy. Radioactive seed localization is performed in anticipation of excisional biopsy tomorrow. EXAM: MAMMOGRAPHIC GUIDED RADIOACTIVE SEED LOCALIZATION OF THE LEFT BREAST COMPARISON:  Previous exam(s). FINDINGS: Patient presents for radioactive seed localization prior to LEFT breast excisional biopsy. I met with the patient and we discussed the procedure of seed localization including benefits and alternatives. We discussed the high likelihood of a successful procedure. We discussed the risks of the procedure including infection, bleeding, tissue injury and further surgery. We discussed the low dose of radioactivity involved in the procedure. Informed, written consent was given. The usual time-out protocol was performed immediately prior to the procedure. Using mammographic guidance, sterile technique with chlorhexidine as skin antisepsis, 1% lidocaine as local anesthetic, an I-125 radioactive seed was used to localize the barbell shaped tissue marker clip associated with the biopsy-proven ADH using a lateral  approach. The follow-up mammogram images confirm that the seed is in the expected location adjacent to the clip. The images are marked for Dr. Georgette Dover. Follow-up survey of the patient confirms the presence of the radioactive seed. Order number of I-125 seed: [id number] Total activity: 0.260 mCi Reference Date: 02/15/2021 The patient tolerated the procedure well and was released from the [facility]. She was given instructions regarding seed removal. IMPRESSION: Radioactive seed localization of biopsy-proven atypical ductal hyperplasia involving the lower LEFT breast. No apparent complications. Electronically Signed   By: Evangeline Dakin M.D.   On: 04/24/2021 15:20     IMPRESSION/PLAN: This is a delightful 67 yo woman with left breast DCIS   She would like to be reasonably aggressive to prevent a recurrence post operatively. She understands radiotherapy is prophylactic, and there is no way to know for sure if there is residual carcinoma in  her breast or not.  It was a pleasure meeting the patient today. We discussed the risks, benefits, and side effects of adjuvant radiotherapy. I recommend radiotherapy to the left breast to reduce her risk of locoregional recurrence by half.  We discussed that radiation would take approximately 4 weeks to complete.  We spoke about acute effects including skin irritation and fatigue and breast swelling or tenderness as well as much less common late effects including internal organ injury or irritation (ie lungs, heart, ribs). We spoke about the latest technology that is used to minimize the risk of late effects for patients undergoing radiotherapy to the breast or chest wall. No guarantees of treatment were given. The patient is enthusiastic about proceeding with treatment. I look forward to participating in the patient's care. Consent signed today; she will be scheduled soon for CT simulation/treatment planning.  She will meet with Dr. Burr Medico to discuss anti estrogen therapy  as well.   On date of service, in total, I spent 45 minutes on this encounter. Patient was seen in person.   __________________________________________   Eppie Gibson, MD  This document serves as a record of services personally performed by Eppie Gibson, MD. It was created on her behalf by Roney Mans, a trained medical scribe. The creation of this record is based on the scribe's personal observations and the provider's statements to them. This document has been checked and approved by the attending provider.

## 2021-05-20 NOTE — Progress Notes (Signed)
Location of Breast Cancer:  Ductal carcinoma in situ (DCIS) of left breast  Histology per Pathology Report:  04/25/2021 FINAL MICROSCOPIC DIAGNOSIS:  A. BREAST, LEFT, LUMPECTOMY:  - Ductal carcinoma in situ, intermediate grade, focally involving a  papillary lesion, see comment  - Resection margins are negative for DCIS - closest is the inferior  margin at less than 1 mm  - Mild fibrocystic change with usual ductal hyperplasia and apocrine metaplasia  - Biopsy site changes  Receptor Status: ER(100%), PR (100%)  Did patient present with symptoms (if so, please note symptoms) or was this found on screening mammography?: Patient is 16 years status post right breast lumpectomy for florid hyperplasia.  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  This area was unable to be localized and so she did not have excision.  She has been followed with MRI for the last several years.  MRI remains negative.  A recent screening mammogram showed a cluster of calcifications in the left upper outer quadrant.   Past/Anticipated interventions by surgeon, if any:  04/25/2021 --Dr. Donnie Mesa Left radioactive seed localized lumpectomy  Past/Anticipated interventions by medical oncology, if any:  Scheduled for consultation with Dr. Truitt Merle on 05/22/2021  Lymphedema issues, if any:  Patient denies (Has post-op visit with Dr. Georgette Dover tomorrow)    Pain issues, if any:  Reports occasional twinges, but overall denies any issues or concerns   SAFETY ISSUES: Prior radiation? No Pacemaker/ICD? No Possible current pregnancy? No--postmenopausal  Is the patient on methotrexate? No  Current Complaints / other details:  Nothing else of note

## 2021-05-21 ENCOUNTER — Ambulatory Visit
Admission: RE | Admit: 2021-05-21 | Discharge: 2021-05-21 | Disposition: A | Discharge status: Home / Self Care | Payer: Medicare Other | Source: Ambulatory Visit | Attending: Radiation Oncology | Admitting: Radiation Oncology

## 2021-05-21 ENCOUNTER — Encounter: Payer: Self-pay | Admitting: Radiation Oncology

## 2021-05-21 ENCOUNTER — Other Ambulatory Visit: Payer: Self-pay

## 2021-05-21 DIAGNOSIS — Z79899 Other long term (current) drug therapy: Secondary | ICD-10-CM | POA: Diagnosis not present

## 2021-05-21 DIAGNOSIS — Z809 Family history of malignant neoplasm, unspecified: Secondary | ICD-10-CM | POA: Diagnosis not present

## 2021-05-21 DIAGNOSIS — E785 Hyperlipidemia, unspecified: Secondary | ICD-10-CM | POA: Insufficient documentation

## 2021-05-21 DIAGNOSIS — K219 Gastro-esophageal reflux disease without esophagitis: Secondary | ICD-10-CM | POA: Insufficient documentation

## 2021-05-21 DIAGNOSIS — Z170 Estrogen receptor positive status [ER+]: Secondary | ICD-10-CM | POA: Insufficient documentation

## 2021-05-21 DIAGNOSIS — I10 Essential (primary) hypertension: Secondary | ICD-10-CM | POA: Insufficient documentation

## 2021-05-21 DIAGNOSIS — M8580 Other specified disorders of bone density and structure, unspecified site: Secondary | ICD-10-CM | POA: Insufficient documentation

## 2021-05-21 DIAGNOSIS — D0512 Intraductal carcinoma in situ of left breast: Secondary | ICD-10-CM | POA: Diagnosis not present

## 2021-05-21 DIAGNOSIS — Z79811 Long term (current) use of aromatase inhibitors: Secondary | ICD-10-CM | POA: Insufficient documentation

## 2021-05-22 ENCOUNTER — Encounter: Payer: Self-pay | Admitting: Hematology

## 2021-05-22 ENCOUNTER — Inpatient Hospital Stay: Payer: Medicare Other | Attending: Hematology | Admitting: Hematology

## 2021-05-22 VITALS — BP 146/78 | HR 68 | Temp 97.8°F | Resp 18 | Ht 67.5 in | Wt 186.3 lb

## 2021-05-22 DIAGNOSIS — Z800 Family history of malignant neoplasm of digestive organs: Secondary | ICD-10-CM | POA: Diagnosis not present

## 2021-05-22 DIAGNOSIS — D0512 Intraductal carcinoma in situ of left breast: Secondary | ICD-10-CM | POA: Insufficient documentation

## 2021-05-22 DIAGNOSIS — I10 Essential (primary) hypertension: Secondary | ICD-10-CM | POA: Insufficient documentation

## 2021-05-22 DIAGNOSIS — Z808 Family history of malignant neoplasm of other organs or systems: Secondary | ICD-10-CM | POA: Diagnosis not present

## 2021-05-22 MED ORDER — ANASTROZOLE 1 MG PO TABS: 1.0000 mg | ORAL_TABLET | Freq: Every day | ORAL | 1 refills | Status: DC

## 2021-05-22 NOTE — Progress Notes (Addendum)
[place]   [phone] Fax:([phone]) [place] Note   Patient Care Team: Ginger Organ., MD as PCP - General (Internal Medicine)  Date of Service:  05/22/2021   CHIEF COMPLAINTS/PURPOSE OF CONSULTATION:  Left Breast DCIS, ER+  REFERRING PHYSICIAN:  Dr. Georgette Dover   ASSESSMENT & PLAN:  Tina [name] is a 66 y.o. postmenopausal female with  1. Left breast DCIS, grade 2, ER+/PR+ -she has a history of left breast LCIS in 2003, [place] in 2018.  Due to her high risk for breast cancer, she is being getting annual mammogram and breast MRI for screening. -found on screening breast MRI. Initial biopsy on 01/30/21 showed only ADH. Lumpectomy on 04/25/21 showed intermediate grade DCIS. -I discussed her breast imaging and pathology results with patient in great detail. -Given her strong family history of cancer, we recommend her to undergo genetic testing to ruled out inheritable breast cancer, she is interested  -Her DCIS was cured by complete surgical resection. Any form of adjuvant therapy is preventive. -Given her strongly positive ER and PR, I recommend antiestrogen therapy, which decrease her risk of future breast cancer by ~40%. Given her good health, she would likely tolerate any of the antiestrogen medications. I recommend anastrozole for her. --The potential benefit and side effects, which includes but not limited to, hot flash, skin and vaginal dryness, metabolic changes ( increased blood glucose, cholesterol, weight, etc.), slightly in increased risk of cardiovascular disease, cataracts, muscular and joint discomfort, osteopenia and osteoporosis, etc, were discussed with her in great details. She is interested, and we'll start after she completes radiation. -She will likely benefit from breast radiation if she undergo lumpectomy to decrease the risk of breast cancer. She is scheduled for CT simulation on 05/29/21. -We discussed breast cancer  surveillance after she completes treatment, Including annual mammogram, breast exam every 6-12 months.  2. Genetics -she has no family history of breast cancer but notes other cancers-- colon on both sides, parotid in her father, and uterine in her mother.  3. Bone Health  -Her most recent DEXA in 2021 was normal (T-score -0.7), will repeat every 2 years    PLAN:  -Genetic refer -CT simulation scheduled for 05/29/21, she will proceed adjuvant radiation  -I will go ahead and prescribe anastrozole for her. I advised her to start about a month after she finishes radiation. -Survivorship with Bella Kennedy in 5 months -Lab and follow-up with me in 8 months   Oncology History Overview Note   Cancer Staging  Ductal carcinoma in situ (DCIS) of left breast Staging form: Breast, AJCC 8th Edition - Pathologic stage from 04/25/2021: Stage Unknown (pTis (DCIS), pNX, cM0, G2, ER+, PR+, HER2: Not Assessed) - Signed by Truitt Merle, MD on 05/22/2021    Ductal carcinoma in situ (DCIS) of left breast  01/17/2021 Imaging   EXAM: BILATERAL BREAST MRI WITH AND WITHOUT CONTRAST  IMPRESSION: Indeterminate clumped enhancement in the LOWER central LEFT breast warranting tissue diagnosis. Given the appearance, 9 sonographic correlate is unlikely.   Benign LEFT hepatic cyst   01/30/2021 Initial Biopsy   Diagnosis Breast, left, needle core biopsy, lower, central, enhancement marked with barbell clip - ATYPICAL DUCTAL HYPERPLASIA. - FIBROCYSTIC CHANGE AND USUAL DUCTAL HYPERPLASIA.   04/25/2021 Cancer Staging   Staging form: Breast, AJCC 8th Edition - Pathologic stage from 04/25/2021: Stage Unknown (pTis (DCIS), pNX, cM0, G2, ER+, PR+, HER2: Not Assessed) - Signed by Truitt Merle, MD on 05/22/2021 Stage prefix:  Initial diagnosis Histologic grading system: 3 grade system Residual tumor (R): R0 - None    04/25/2021 Definitive Surgery   FINAL MICROSCOPIC DIAGNOSIS:   A. BREAST, LEFT, LUMPECTOMY:  - Ductal carcinoma  in situ, intermediate grade, focally involving a  papillary lesion, see comment  - Resection margins are negative for DCIS - closest is the inferior  margin at less than 1 mm  - Mild fibrocystic change with usual ductal hyperplasia and apocrine metaplasia  - Biopsy site changes  - See oncology table   ADDENDUM:  PROGNOSTIC INDICATOR RESULTS:  Estrogen Receptor: POSITIVE, 100% STRONG STAINING INTENSITY  Progesterone Receptor: POSITIVE, 100% STRONG STAINING INTENSITY    05/21/2021 Initial Diagnosis   Ductal carcinoma in situ (DCIS) of left breast      HISTORY OF PRESENTING ILLNESS:  Tina [name] 66 y.o. female is a here because of breast cancer. The patient was referred by Dr. Georgette Dover. The patient presents to the clinic today alone.   She has a history of left breast LCIS and right breast sclerosing papillary lesion in 03/2002. She had routine screening breast MRI on 01/17/21 showing a 1.7 cm area of indeterminate clumped enhancement in lower central left breast.   Biopsy on 01/30/21 showed: ADH. She underwent bilateral diagnostic mammography on 02/12/21 showing: biopsy-proven ADH, no new suspicious findings in bilateral breasts.  She proceeded to left lumpectomy on 04/25/21 under Dr. Georgette Dover. Pathology from the procedure showed: DCIS, intermediate grade, focally involving a papillary lesion Prognostic indicators significant for: estrogen receptor, 100% positive and progesterone receptor, 100% positive.   Today the patient notes they felt/feeling prior/after... -she denies any pain or current breast concerns  She has a PMHx of.... -HTN -rosacea   Socially... -she works as a [employer] -she is married with two sons -she denies a family history of breast cancer but notes other types of cancers   GYN HISTORY  Menarchal: xx LMP: xx Contraceptive: used BCP for about 25 years total HRT:  GP: 2   REVIEW OF SYSTEMS:    Constitutional: Denies fevers, chills or abnormal night  sweats Eyes: Denies blurriness of vision, double vision or watery eyes Ears, nose, mouth, throat, and face: Denies mucositis or sore throat Respiratory: Denies cough, dyspnea or wheezes Cardiovascular: Denies palpitation, chest discomfort or lower extremity swelling Gastrointestinal:  Denies nausea, heartburn or change in bowel habits Skin: Denies abnormal skin rashes Lymphatics: Denies new lymphadenopathy or easy bruising Neurological:Denies numbness, tingling or new weaknesses Behavioral/Psych: Mood is stable, no new changes  All other systems were reviewed with the patient and are negative.   MEDICAL HISTORY:  Past Medical History:  Diagnosis Date   Acid reflux    Cancer ([place]) 2002   Breast cancer-Lobular carcinoma in situ   HTN (hypertension)    Hyperlipidemia    Hypertension    Osteopenia 07/2011   t score -1.1 FRAX 13%/0.4%   Rosacea    Schatzki's ring     SURGICAL HISTORY: Past Surgical History:  Procedure Laterality Date   BREAST BIOPSY Left    Stereo High Risk    BREAST EXCISIONAL BIOPSY Right    BREAST EXCISIONAL BIOPSY Left 2018   BREAST LUMPECTOMY WITH RADIOACTIVE SEED LOCALIZATION Left 07/22/2016   Procedure: LEFT BREAST LUMPECTOMY WITH RADIOACTIVE SEED LOCALIZATION;  Surgeon: Donnie Mesa, MD;  Location: [facility];  Service: General;  Laterality: Left;   BREAST LUMPECTOMY WITH RADIOACTIVE SEED LOCALIZATION Left 04/25/2021   Procedure: LEFT BREAST LUMPECTOMY WITH RADIOACTIVE SEED LOCALIZATION;  Surgeon: Donnie Mesa,  MD;  Location: [facility];  Service: General;  Laterality: Left;   BREAST SURGERY  2004   excisional right breast surgery    CHOLECYSTECTOMY  2003   [place] N/A 01/22/2021   Procedure: DILATATION & CURETTAGE/HYSTEROSCOPY WITH MYOSURE RESECTION OF ENDOMETERIAL POLYP;  Surgeon: Nunzio Cobbs, MD;  Location: [facility];  Service: Gynecology;  Laterality: N/A;    SOCIAL  HISTORY: Social History   Socioeconomic History   Marital status: Married    Spouse name: Not on file   Number of children: 2   Years of education: Not on file   Highest education level: Not on file  Occupational History   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[employer], works PRN  Tobacco Use   Smoking status: Never   Smokeless tobacco: Never  Scientific laboratory technician Use: Never used  Substance and Sexual Activity   Alcohol use: Yes    Comment: Once a month   Drug use: Never   Sexual activity: Yes    Partners: Male    Birth control/protection: Post-menopausal    Comment: 1st intercourse 11 yo-1 partner  Other Topics Concern   Not on file  Social History Narrative   Not on file   Social Determinants of Health   Financial Resource Strain: Not on file  Food Insecurity: Not on file  Transportation Needs: Not on file  Physical Activity: Not on file  Stress: Not on file  Social Connections: Not on file  Intimate Partner Violence: Not on file    FAMILY HISTORY: Family History  Problem Relation Age of Onset   Stroke Father 72   Diabetes Father    Hypertension Father    Heart disease Father        bypass   Cancer Father        parotid cancer   Cancer Mother 57       uterine   Osteoporosis Mother    Dementia Mother    Diabetes Brother        type 2   Diabetes Son 55       MODY   Asthma Son    Cancer Maternal Uncle        colon   Cancer Paternal Uncle        colon    ALLERGIES:  is allergic to dextromethorphan.  MEDICATIONS:  Current Outpatient Medications  Medication Sig Dispense Refill   anastrozole (ARIMIDEX) 1 MG tablet Take 1 tablet (1 mg total) by mouth daily. 90 tablet 1   acetaminophen (TYLENOL) 325 MG tablet Take 650 mg by mouth every 6 (six) hours as needed (for pain/headaches.).     Calcium Carb-Cholecalciferol 600-800 MG-UNIT TABS Take 1 tablet by mouth at bedtime.     cholecalciferol (VITAMIN D) 1000 units tablet Take 1,000 Units by mouth  at bedtime.     losartan (COZAAR) 100 MG tablet Take 1 tablet by mouth daily.     LOVAZA 1 G capsule Take 2 g by mouth 2 (two) times daily.      MIRVASO 0.33 % GEL Apply 1 application topically daily. Applied to face daily in the morning.     Multiple Vitamin (MULTIVITAMIN) capsule Take 1 capsule by mouth daily.     XIIDRA 5 % SOLN      No current facility-administered medications for this visit.    PHYSICAL EXAMINATION: ECOG PERFORMANCE STATUS: 0 - Asymptomatic  Vitals:  05/22/21 1511  BP: (!) 146/78  Pulse: 68  Resp: 18  Temp: 97.8 F (36.6 C)  SpO2: 100%   Filed Weights   05/22/21 1511  Weight: 186 lb 4.8 oz (84.5 kg)    GENERAL:alert, no distress and comfortable SKIN: skin color, texture, turgor are normal, no rashes or significant lesions EYES: normal, Conjunctiva are pink and non-injected, sclera clear  NECK: supple, thyroid normal size, non-tender, without nodularity LYMPH:  no palpable lymphadenopathy in the cervical, axillary  LUNGS: clear to auscultation and percussion with normal breathing effort HEART: regular rate & rhythm and no murmurs and no lower extremity edema ABDOMEN:abdomen soft, non-tender and normal bowel sounds Musculoskeletal:no cyanosis of digits and no clubbing  NEURO: alert & oriented x 3 with fluent speech, no focal motor/sensory deficits BREAST: Surgical scars in the left breast have healed very well.  No palpable mass, nodules or adenopathy bilaterally. Breast exam benign.  LABORATORY DATA:  I have reviewed the data as listed CBC Latest Ref Rng & Units 01/22/2021 12/16/2016 07/15/2016  WBC 4.0 - 10.5 K/uL 5.4 6.5 7.8  Hemoglobin 12.0 - 15.0 g/dL 13.8 13.5 13.5  Hematocrit 36.0 - 46.0 % 40.3 38.7 40.5  Platelets 150 - 400 K/uL 254 214 253    CMP Latest Ref Rng & Units 01/22/2021 12/16/2016 07/15/2016  Glucose 70 - 99 mg/dL 104(H) 152(H) 104(H)  BUN 8 - 23 mg/dL _0 Creatinine 0.44 - 1.00 mg/dL 0.77 0.89 0.82  Sodium 135 - 145 mmol/L 141  141 141  Potassium 3.5 - 5.1 mmol/L 4.1 3.3(L) 3.6  Chloride 98 - 111 mmol/L 107 107 106  CO2 22 - 32 mmol/L _1 Calcium 8.9 - 10.3 mg/dL 9.3 9.7 9.5  Total Protein 6.0 - 8.3 g/dL - - -  Total Bilirubin 0.3 - 1.2 mg/dL - - -  Alkaline Phos 39 - 117 U/L - - -  AST 0 - 37 U/L - - -  ALT 0 - 35 U/L - - -     RADIOGRAPHIC STUDIES: I have personally reviewed the radiological images as listed and agreed with the findings in the report. MM Breast Surgical Specimen  Result Date: 04/25/2021 CLINICAL DATA:  Surgical specimen mammogram. Excision of a left breast lesion following radioactive seed localization. EXAM: SPECIMEN RADIOGRAPH OF THE LEFT BREAST COMPARISON:  Previous exam(s). FINDINGS: Status post excision of the left breast. The radioactive seed and biopsy marker clip are present, completely intact, and were marked for pathology. IMPRESSION: Specimen radiograph of the left breast. Electronically Signed   By: Lajean Manes M.D.   On: 04/25/2021 14:15  MM LT RADIOACTIVE SEED LOC MAMMO GUIDE  Result Date: 04/24/2021 CLINICAL DATA:  66 year old with biopsy-proven atypical ductal hyperplasia involving the lower LEFT breast at middle to posterior depth, near 6 o'clock location, identified on recent MRI core needle biopsy. Radioactive seed localization is performed in anticipation of excisional biopsy tomorrow. EXAM: MAMMOGRAPHIC GUIDED RADIOACTIVE SEED LOCALIZATION OF THE LEFT BREAST COMPARISON:  Previous exam(s). FINDINGS: Patient presents for radioactive seed localization prior to LEFT breast excisional biopsy. I met with the patient and we discussed the procedure of seed localization including benefits and alternatives. We discussed the high likelihood of a successful procedure. We discussed the risks of the procedure including infection, bleeding, tissue injury and further surgery. We discussed the low dose of radioactivity involved in the procedure. Informed, written consent was given. The  usual time-out protocol was performed immediately prior to the procedure. Using  mammographic guidance, sterile technique with chlorhexidine as skin antisepsis, 1% lidocaine as local anesthetic, an I-125 radioactive seed was used to localize the barbell shaped tissue marker clip associated with the biopsy-proven ADH using a lateral approach. The follow-up mammogram images confirm that the seed is in the expected location adjacent to the clip. The images are marked for Dr. Georgette Dover. Follow-up survey of the patient confirms the presence of the radioactive seed. Order number of I-125 seed: [id number] Total activity: 0.260 mCi Reference Date: 02/15/2021 The patient tolerated the procedure well and was released from the [facility]. She was given instructions regarding seed removal. IMPRESSION: Radioactive seed localization of biopsy-proven atypical ductal hyperplasia involving the lower LEFT breast. No apparent complications. Electronically Signed   By: Evangeline Dakin M.D.   On: 04/24/2021 15:20    Orders Placed This Encounter  Procedures   Ambulatory referral to Genetics    Referral Priority:   Routine    Referral Type:   Consultation    Referral Reason:   Specialty Services Required    Number of Visits Requested:   1    All questions were answered. The patient knows to call the clinic with any problems, questions or concerns. The total time spent in the appointment was 40 minutes.     Truitt Merle, MD 05/22/2021 9:30 PM  I, Wilburn Mylar, am acting as scribe for Truitt Merle, MD.   I have reviewed the above documentation for accuracy and completeness, and I agree with the above.

## 2021-05-23 ENCOUNTER — Telehealth: Payer: Self-pay | Admitting: Hematology

## 2021-05-23 ENCOUNTER — Encounter: Payer: Self-pay | Admitting: *Deleted

## 2021-05-23 NOTE — Telephone Encounter (Signed)
Scheduled appointment per 11/1 scheduling message. Patient is aware. 

## 2021-05-24 ENCOUNTER — Encounter: Payer: Self-pay | Admitting: Radiation Oncology

## 2021-05-28 ENCOUNTER — Telehealth: Payer: Self-pay | Admitting: Hematology

## 2021-05-28 NOTE — Telephone Encounter (Signed)
Left message with follow-up appointments per 11/1 los. 

## 2021-05-29 ENCOUNTER — Ambulatory Visit
Admission: RE | Admit: 2021-05-29 | Discharge: 2021-05-29 | Disposition: A | Discharge status: Home / Self Care | Payer: Medicare Other | Source: Ambulatory Visit | Attending: Radiation Oncology | Admitting: Radiation Oncology

## 2021-05-29 ENCOUNTER — Other Ambulatory Visit: Payer: Self-pay

## 2021-05-29 DIAGNOSIS — Z510 Encounter for antineoplastic radiation therapy: Secondary | ICD-10-CM | POA: Diagnosis not present

## 2021-05-29 DIAGNOSIS — D0512 Intraductal carcinoma in situ of left breast: Secondary | ICD-10-CM | POA: Insufficient documentation

## 2021-05-31 DIAGNOSIS — D0512 Intraductal carcinoma in situ of left breast: Secondary | ICD-10-CM | POA: Diagnosis not present

## 2021-06-04 ENCOUNTER — Encounter: Payer: Self-pay | Admitting: *Deleted

## 2021-06-05 ENCOUNTER — Ambulatory Visit: Payer: Medicare Other | Admitting: Radiation Oncology

## 2021-06-06 ENCOUNTER — Ambulatory Visit
Admission: RE | Admit: 2021-06-06 | Discharge: 2021-06-06 | Disposition: A | Discharge status: Home / Self Care | Payer: Medicare Other | Source: Ambulatory Visit | Attending: Radiation Oncology | Admitting: Radiation Oncology

## 2021-06-06 DIAGNOSIS — D0512 Intraductal carcinoma in situ of left breast: Secondary | ICD-10-CM | POA: Diagnosis not present

## 2021-06-07 ENCOUNTER — Ambulatory Visit
Admission: RE | Admit: 2021-06-07 | Discharge: 2021-06-07 | Disposition: A | Discharge status: Home / Self Care | Payer: Medicare Other | Source: Ambulatory Visit | Attending: Radiation Oncology | Admitting: Radiation Oncology

## 2021-06-07 ENCOUNTER — Other Ambulatory Visit: Payer: Self-pay

## 2021-06-07 DIAGNOSIS — D0512 Intraductal carcinoma in situ of left breast: Secondary | ICD-10-CM | POA: Diagnosis not present

## 2021-06-08 ENCOUNTER — Encounter: Payer: Self-pay | Admitting: Obstetrics and Gynecology

## 2021-06-10 ENCOUNTER — Other Ambulatory Visit: Payer: Self-pay

## 2021-06-10 ENCOUNTER — Ambulatory Visit
Admission: RE | Admit: 2021-06-10 | Discharge: 2021-06-10 | Disposition: A | Discharge status: Home / Self Care | Payer: Medicare Other | Source: Ambulatory Visit | Attending: Radiation Oncology | Admitting: Radiation Oncology

## 2021-06-10 DIAGNOSIS — D0512 Intraductal carcinoma in situ of left breast: Secondary | ICD-10-CM

## 2021-06-10 MED ORDER — RADIAPLEXRX EX GEL: Freq: Once | CUTANEOUS | Status: AC

## 2021-06-10 MED ORDER — ALRA NON-METALLIC DEODORANT (RAD-ONC)
1.0000 "application " | Freq: Once | TOPICAL | Status: AC
  Administered 2021-06-10: 1 via TOPICAL

## 2021-06-10 NOTE — Progress Notes (Signed)

## 2021-06-11 ENCOUNTER — Ambulatory Visit
Admission: RE | Admit: 2021-06-11 | Discharge: 2021-06-11 | Disposition: A | Discharge status: Home / Self Care | Payer: Medicare Other | Source: Ambulatory Visit | Attending: Radiation Oncology | Admitting: Radiation Oncology

## 2021-06-11 DIAGNOSIS — D0512 Intraductal carcinoma in situ of left breast: Secondary | ICD-10-CM | POA: Diagnosis not present

## 2021-06-12 ENCOUNTER — Ambulatory Visit
Admission: RE | Admit: 2021-06-12 | Discharge: 2021-06-12 | Disposition: A | Discharge status: Home / Self Care | Payer: Medicare Other | Source: Ambulatory Visit | Attending: Radiation Oncology | Admitting: Radiation Oncology

## 2021-06-12 DIAGNOSIS — Z510 Encounter for antineoplastic radiation therapy: Secondary | ICD-10-CM | POA: Insufficient documentation

## 2021-06-12 DIAGNOSIS — D0512 Intraductal carcinoma in situ of left breast: Secondary | ICD-10-CM | POA: Diagnosis present

## 2021-06-13 ENCOUNTER — Other Ambulatory Visit: Payer: Self-pay

## 2021-06-13 ENCOUNTER — Ambulatory Visit
Admission: RE | Admit: 2021-06-13 | Discharge: 2021-06-13 | Disposition: A | Discharge status: Home / Self Care | Payer: Medicare Other | Source: Ambulatory Visit | Attending: Radiation Oncology | Admitting: Radiation Oncology

## 2021-06-13 DIAGNOSIS — D0512 Intraductal carcinoma in situ of left breast: Secondary | ICD-10-CM | POA: Diagnosis not present

## 2021-06-14 ENCOUNTER — Ambulatory Visit
Admission: RE | Admit: 2021-06-14 | Discharge: 2021-06-14 | Disposition: A | Discharge status: Home / Self Care | Payer: Medicare Other | Source: Ambulatory Visit | Attending: Radiation Oncology | Admitting: Radiation Oncology

## 2021-06-14 DIAGNOSIS — D0512 Intraductal carcinoma in situ of left breast: Secondary | ICD-10-CM | POA: Diagnosis not present

## 2021-06-17 ENCOUNTER — Other Ambulatory Visit: Payer: Self-pay

## 2021-06-17 ENCOUNTER — Ambulatory Visit
Admission: RE | Admit: 2021-06-17 | Discharge: 2021-06-17 | Disposition: A | Discharge status: Home / Self Care | Payer: Medicare Other | Source: Ambulatory Visit | Attending: Radiation Oncology | Admitting: Radiation Oncology

## 2021-06-17 DIAGNOSIS — D0512 Intraductal carcinoma in situ of left breast: Secondary | ICD-10-CM | POA: Diagnosis not present

## 2021-06-18 ENCOUNTER — Ambulatory Visit
Admission: RE | Admit: 2021-06-18 | Discharge: 2021-06-18 | Disposition: A | Discharge status: Home / Self Care | Payer: Medicare Other | Source: Ambulatory Visit | Attending: Radiation Oncology | Admitting: Radiation Oncology

## 2021-06-18 DIAGNOSIS — D0512 Intraductal carcinoma in situ of left breast: Secondary | ICD-10-CM | POA: Diagnosis not present

## 2021-06-19 ENCOUNTER — Ambulatory Visit
Admission: RE | Admit: 2021-06-19 | Discharge: 2021-06-19 | Disposition: A | Discharge status: Home / Self Care | Payer: Medicare Other | Source: Ambulatory Visit | Attending: Radiation Oncology | Admitting: Radiation Oncology

## 2021-06-19 DIAGNOSIS — D0512 Intraductal carcinoma in situ of left breast: Secondary | ICD-10-CM | POA: Diagnosis not present

## 2021-06-20 ENCOUNTER — Other Ambulatory Visit: Payer: Self-pay

## 2021-06-20 ENCOUNTER — Inpatient Hospital Stay: Payer: Medicare Other | Attending: Hematology | Admitting: Genetic Counselor

## 2021-06-20 ENCOUNTER — Inpatient Hospital Stay: Payer: Medicare Other

## 2021-06-20 ENCOUNTER — Other Ambulatory Visit: Payer: Self-pay | Admitting: Genetic Counselor

## 2021-06-20 ENCOUNTER — Ambulatory Visit
Admission: RE | Admit: 2021-06-20 | Discharge: 2021-06-20 | Disposition: A | Discharge status: Home / Self Care | Payer: Medicare Other | Source: Ambulatory Visit | Attending: Radiation Oncology | Admitting: Radiation Oncology

## 2021-06-20 DIAGNOSIS — D0512 Intraductal carcinoma in situ of left breast: Secondary | ICD-10-CM

## 2021-06-20 DIAGNOSIS — Z8049 Family history of malignant neoplasm of other genital organs: Secondary | ICD-10-CM | POA: Diagnosis not present

## 2021-06-20 DIAGNOSIS — Z800 Family history of malignant neoplasm of digestive organs: Secondary | ICD-10-CM | POA: Diagnosis not present

## 2021-06-20 LAB — GENETIC SCREENING ORDER

## 2021-06-21 ENCOUNTER — Ambulatory Visit
Admission: RE | Admit: 2021-06-21 | Discharge: 2021-06-21 | Disposition: A | Discharge status: Home / Self Care | Payer: Medicare Other | Source: Ambulatory Visit | Attending: Radiation Oncology | Admitting: Radiation Oncology

## 2021-06-21 DIAGNOSIS — D0512 Intraductal carcinoma in situ of left breast: Secondary | ICD-10-CM | POA: Diagnosis not present

## 2021-06-23 ENCOUNTER — Encounter: Payer: Self-pay | Admitting: Genetic Counselor

## 2021-06-23 DIAGNOSIS — Z8049 Family history of malignant neoplasm of other genital organs: Secondary | ICD-10-CM

## 2021-06-23 DIAGNOSIS — Z800 Family history of malignant neoplasm of digestive organs: Secondary | ICD-10-CM

## 2021-06-23 HISTORY — DX: Family history of malignant neoplasm of other genital organs: Z80.49

## 2021-06-23 HISTORY — DX: Family history of malignant neoplasm of digestive organs: Z80.0

## 2021-06-23 NOTE — Progress Notes (Signed)
REFERRING PROVIDER: Truitt Merle, MD [address] [place],  [place] [address]  PRIMARY PROVIDER:  Ginger Organ., MD  PRIMARY REASON FOR VISIT:  1. Ductal carcinoma in situ (DCIS) of left breast   2. Family history of colon cancer   3. Family history of uterine cancer     HISTORY OF PRESENT ILLNESS:   Tina [name], a 66 y.o. female, was seen for a [place] cancer genetics consultation at the request of Dr. Burr Medico due to a personal and family history of cancer.  Tina [name] presents to clinic today to discuss the possibility of a hereditary predisposition to cancer, to discuss genetic testing, and to further clarify her future cancer risks, as well as potential cancer risks for family members.   In 2022, at the age of 65, Tina [name] was diagnosed with ductal carcinoma in situ of the left breast (ER+/PR+) s/p lumpectomy.   CANCER HISTORY:  Oncology History Overview Note   Cancer Staging  Ductal carcinoma in situ (DCIS) of left breast Staging form: Breast, AJCC 8th Edition - Pathologic stage from 04/25/2021: Stage Unknown (pTis (DCIS), pNX, cM0, G2, ER+, PR+, HER2: Not Assessed) - Signed by Truitt Merle, MD on 05/22/2021    Ductal carcinoma in situ (DCIS) of left breast  01/17/2021 Imaging   EXAM: BILATERAL BREAST MRI WITH AND WITHOUT CONTRAST  IMPRESSION: Indeterminate clumped enhancement in the LOWER central LEFT breast warranting tissue diagnosis. Given the appearance, 9 sonographic correlate is unlikely.   Benign LEFT hepatic cyst   01/30/2021 Initial Biopsy   Diagnosis Breast, left, needle core biopsy, lower, central, enhancement marked with barbell clip - ATYPICAL DUCTAL HYPERPLASIA. - FIBROCYSTIC CHANGE AND USUAL DUCTAL HYPERPLASIA.   04/25/2021 Cancer Staging   Staging form: Breast, AJCC 8th Edition - Pathologic stage from 04/25/2021: Stage Unknown (pTis (DCIS), pNX, cM0, G2, ER+, PR+, HER2: Not Assessed) - Signed by Truitt Merle, MD on 05/22/2021 Stage prefix: Initial  diagnosis Histologic grading system: 3 grade system Residual tumor (R): R0 - None    04/25/2021 Definitive Surgery   FINAL MICROSCOPIC DIAGNOSIS:   A. BREAST, LEFT, LUMPECTOMY:  - Ductal carcinoma in situ, intermediate grade, focally involving a  papillary lesion, see comment  - Resection margins are negative for DCIS - closest is the inferior  margin at less than 1 mm  - Mild fibrocystic change with usual ductal hyperplasia and apocrine metaplasia  - Biopsy site changes  - See oncology table   ADDENDUM:  PROGNOSTIC INDICATOR RESULTS:  Estrogen Receptor: POSITIVE, 100% STRONG STAINING INTENSITY  Progesterone Receptor: POSITIVE, 100% STRONG STAINING INTENSITY    05/21/2021 Initial Diagnosis   Ductal carcinoma in situ (DCIS) of left breast     RISK FACTORS:  Menarche was at age 69.  First live birth at age 41.  OCP use for approximately  25-30  years.  Ovaries intact: yes.  Hysterectomy: no.  Menopausal status: postmenopausal.  HRT use: 0 years. Colonoscopy: yes;  most recent in 2021; reports 2-3 lifetime polyps . Mammogram within the last year: yes. Up to date with pelvic exams: yes.   Past Medical History:  Diagnosis Date   Acid reflux    Cancer ([place]) 2002   Breast cancer-Lobular carcinoma in situ   Family history of colon cancer 06/23/2021   Family history of uterine cancer 06/23/2021   HTN (hypertension)    Hyperlipidemia    Hypertension    Osteopenia 07/2011   t score -1.1 FRAX 13%/0.4%   Rosacea  Schatzki's ring     Past Surgical History:  Procedure Laterality Date   BREAST BIOPSY Left    Stereo High Risk    BREAST EXCISIONAL BIOPSY Right    BREAST EXCISIONAL BIOPSY Left 2018   BREAST LUMPECTOMY WITH RADIOACTIVE SEED LOCALIZATION Left 07/22/2016   Procedure: LEFT BREAST LUMPECTOMY WITH RADIOACTIVE SEED LOCALIZATION;  Surgeon: Donnie Mesa, MD;  Location: [facility];  Service: General;  Laterality: Left;   BREAST LUMPECTOMY WITH RADIOACTIVE SEED  LOCALIZATION Left 04/25/2021   Procedure: LEFT BREAST LUMPECTOMY WITH RADIOACTIVE SEED LOCALIZATION;  Surgeon: Donnie Mesa, MD;  Location: [facility];  Service: General;  Laterality: Left;   BREAST SURGERY  2004   excisional right breast surgery    CHOLECYSTECTOMY  2003   [place] N/A 01/22/2021   Procedure: DILATATION & CURETTAGE/HYSTEROSCOPY WITH MYOSURE RESECTION OF ENDOMETERIAL POLYP;  Surgeon: Nunzio Cobbs, MD;  Location: [facility];  Service: Gynecology;  Laterality: N/A;    FAMILY HISTORY:  We obtained a detailed, 4-generation family history.  Significant diagnoses are listed below: Family History  Problem Relation Age of Onset   Uterine cancer Mother 51       uterine   Kidney cancer Mother 11       [name]   Cancer Father 13       parotid   Colon cancer Maternal Uncle        x2 maternal uncles; dx 65s   Colon cancer Paternal Uncle        dx after 77   Melanoma Maternal Grandfather        mets; d. 69s   Thyroid cancer Paternal Grandfather        d. late 88s     Tina [name] is Tina [name] of previous family history of genetic testing for hereditary cancer risks. There is no reported Ashkenazi Jewish ancestry. There is no known consanguinity.  GENETIC COUNSELING ASSESSMENT: Tina [name] is a 66 y.o. female with a personal and family history of cancer which is somewhat suggestive of a hereditayr cancer syndrome and predisposition to cancer given the presence of related cancers in the family. We, therefore, discussed and recommended the following at today's visit.   DISCUSSION: We discussed that 5 - 10% of cancer is hereditary.  Most cases of hereditary uterine and colon cancers are associated with mutations in the Lynch syndrome genes.  There are other genes that can be associated with hereditary cololn, uterine, or breast cancer syndromes.  We discussed that testing is beneficial for several reasons  including knowing how to follow individuals for their cancer risks and understanding if other family members could be at risk for cancer and allowing them to undergo genetic testing.   We reviewed the characteristics, features and inheritance patterns of hereditary cancer syndromes. We also discussed genetic testing, including the appropriate family members to test, the process of testing, insurance coverage and turn-around-time for results. We discussed the implications of a negative, positive, carrier and/or variant of uncertain significant result. We recommended Tina [name] pursue genetic testing for a panel that includes genes associated with breast, colon, and uterine cancer.   Tina [name]  was offered a common hereditary cancer panel (47 genes) and an expanded pan-cancer panel (77 genes). Tina [name] was informed of the benefits and limitations of each panel, including that expanded pan-cancer panels contain genes that do not have clear management guidelines at this point in time.  We also discussed that as  the number of genes included on a panel increases, the chances of variants of uncertain significance increases.  After considering the benefits and limitations of each gene panel, Tina [name]  elected to have expanded pan-cancer panel through [employer] [employer].  The CancerNext-Expanded gene panel offered by [facility] and includes sequencing, rearrangement, and RNA analysis for the following 77 genes: AIP, ALK, APC, ATM, AXIN2, BAP1, BARD1, BLM, BMPR1A, BRCA1, BRCA2, BRIP1, CDC73, CDH1, CDK4, CDKN1B, CDKN2A, CHEK2, CTNNA1, DICER1, FANCC, FH, FLCN, GALNT12, KIF1B, LZTR1, MAX, MEN1, MET, MLH1, MSH2, MSH3, MSH6, MUTYH, NBN, NF1, NF2, NTHL1, PALB2, PHOX2B, PMS2, POT1, PRKAR1A, PTCH1, PTEN, RAD51C, RAD51D, RB1, RECQL, RET, SDHA, SDHAF2, SDHB, SDHC, SDHD, SMAD4, SMARCA4, SMARCB1, SMARCE1, STK11, SUFU, TMEM127, TP53, TSC1, TSC2, VHL and XRCC2 (sequencing and deletion/duplication); EGFR, EGLN1, HOXB13, KIT,  MITF, PDGFRA, POLD1, and POLE (sequencing only); EPCAM and GREM1 (deletion/duplication only).   Based on Tina [name]'s family history of cancer, she meets medical criteria for genetic testing. Despite that she meets criteria, she may still have an out of pocket cost. We discussed that if her out of pocket cost for testing is over $100, the laboratory should contact her and discuss the self-pay prices and/or patient pay assistance programs.    PLAN: After considering the risks, benefits, and limitations, Tina [name] provided informed consent to pursue genetic testing and the blood sample was sent to [facility] for analysis of the CancerNext-Expanded +RNAinsight Panel. Results should be available within approximately 3 weeks' time, at which point they will be disclosed by telephone to Tina [name], as will any additional recommendations warranted by these results. Tina [name] will receive a summary of her genetic counseling visit and a copy of her results once available. This information will also be available in Epic.   Lastly, we encouraged Tina [name] to remain in contact with cancer genetics annually so that we can continuously update the family history and inform her of any changes in cancer genetics and testing that may be of benefit for this family.   Tina [name] questions were answered to her satisfaction today. Our contact information was provided should additional questions or concerns arise. Thank you for the referral and allowing [place] to share in the care of your patient.   Jomes Giraldo M. Joette Catching, [place], [facility] Genetic Counselor [email or web address] (P) [phone]  The patient was seen for a total of 35 minutes in face-to-face genetic counseling.  The patient was seen alone.  Drs. Lindi Adie and/or Burr Medico were available to discuss this case as needed.    _______________________________________________________________________ For Office Staff:  Number of people involved in session: 1 Was an  Intern/ student involved with case: no

## 2021-06-24 ENCOUNTER — Ambulatory Visit: Payer: Medicare Other | Admitting: Radiation Oncology

## 2021-06-24 ENCOUNTER — Ambulatory Visit
Admission: RE | Admit: 2021-06-24 | Discharge: 2021-06-24 | Disposition: A | Discharge status: Home / Self Care | Payer: Medicare Other | Source: Ambulatory Visit | Attending: Radiation Oncology | Admitting: Radiation Oncology

## 2021-06-24 ENCOUNTER — Other Ambulatory Visit: Payer: Self-pay

## 2021-06-24 DIAGNOSIS — D0512 Intraductal carcinoma in situ of left breast: Secondary | ICD-10-CM

## 2021-06-24 MED ORDER — SONAFINE EX EMUL
1.0000 "application " | Freq: Two times a day (BID) | CUTANEOUS | Status: DC
  Administered 2021-06-24: 1 via TOPICAL

## 2021-06-25 ENCOUNTER — Ambulatory Visit
Admission: RE | Admit: 2021-06-25 | Discharge: 2021-06-25 | Disposition: A | Discharge status: Home / Self Care | Payer: Medicare Other | Source: Ambulatory Visit | Attending: Radiation Oncology | Admitting: Radiation Oncology

## 2021-06-25 ENCOUNTER — Encounter: Payer: Self-pay | Admitting: *Deleted

## 2021-06-25 DIAGNOSIS — D0512 Intraductal carcinoma in situ of left breast: Secondary | ICD-10-CM | POA: Diagnosis not present

## 2021-06-26 ENCOUNTER — Ambulatory Visit
Admission: RE | Admit: 2021-06-26 | Discharge: 2021-06-26 | Disposition: A | Discharge status: Home / Self Care | Payer: Medicare Other | Source: Ambulatory Visit | Attending: Radiation Oncology | Admitting: Radiation Oncology

## 2021-06-26 ENCOUNTER — Other Ambulatory Visit: Payer: Self-pay

## 2021-06-26 ENCOUNTER — Telehealth: Payer: Self-pay

## 2021-06-26 DIAGNOSIS — D0512 Intraductal carcinoma in situ of left breast: Secondary | ICD-10-CM | POA: Diagnosis not present

## 2021-06-26 NOTE — Telephone Encounter (Signed)
I called patient and left message because Dr. Elza Rafter reply to her My Chart message has returned unread.  I asked her to login and read or call and we are happy to read it to her.  "Hello Ms. Tina [name],    I have reviewed your message and your chart, and it would make sense to do medical therapy to reduce risk of recurrence of breast cancer given your recurrence of carcinoma in situ of the breasts.    I also agree with having genetic testing due to your personal history of recurrent carcinoma in situ of your breasts and your family history of uterine cancer in your mother and colon cancer in your uncles.   Ultimately, the decision for your treatment will be between you and your oncologist.  Please remember that any therapy may be discontinued if you are not tolerating it well.    I hope this helps in your decision making process.    I wish for you good health.   Josefa Half, MD  This MyChart message has not been read. June 10, 2021"

## 2021-06-27 ENCOUNTER — Ambulatory Visit: Payer: Medicare Other

## 2021-06-27 ENCOUNTER — Ambulatory Visit
Admission: RE | Admit: 2021-06-27 | Discharge: 2021-06-27 | Disposition: A | Discharge status: Home / Self Care | Payer: Medicare Other | Source: Ambulatory Visit | Attending: Radiation Oncology | Admitting: Radiation Oncology

## 2021-06-27 ENCOUNTER — Encounter: Payer: Self-pay | Admitting: Radiation Oncology

## 2021-06-27 DIAGNOSIS — D0512 Intraductal carcinoma in situ of left breast: Secondary | ICD-10-CM | POA: Diagnosis not present

## 2021-06-28 ENCOUNTER — Ambulatory Visit: Payer: Medicare Other

## 2021-07-01 ENCOUNTER — Ambulatory Visit: Payer: Medicare Other

## 2021-07-01 NOTE — Telephone Encounter (Signed)
Patient logged in My Chart and read Dr. Elza Rafter reply. "Last read by Berlinda Last at  4:19 PM on 06/26/2021."

## 2021-07-02 ENCOUNTER — Ambulatory Visit: Payer: Medicare Other

## 2021-07-05 ENCOUNTER — Ambulatory Visit: Payer: Self-pay | Admitting: Genetic Counselor

## 2021-07-05 ENCOUNTER — Encounter: Payer: Self-pay | Admitting: Genetic Counselor

## 2021-07-05 ENCOUNTER — Telehealth: Payer: Self-pay | Admitting: Genetic Counselor

## 2021-07-05 DIAGNOSIS — Z1379 Encounter for other screening for genetic and chromosomal anomalies: Secondary | ICD-10-CM

## 2021-07-05 DIAGNOSIS — Z800 Family history of malignant neoplasm of digestive organs: Secondary | ICD-10-CM

## 2021-07-05 DIAGNOSIS — Z8049 Family history of malignant neoplasm of other genital organs: Secondary | ICD-10-CM

## 2021-07-05 DIAGNOSIS — D0512 Intraductal carcinoma in situ of left breast: Secondary | ICD-10-CM

## 2021-07-05 NOTE — Progress Notes (Signed)
HPI:   Tina [name] was previously seen in the [facility] clinic due to a personal and family history of cancer and concerns regarding a hereditary predisposition to cancer. Please refer to our prior cancer genetics clinic note for more information regarding our discussion, assessment and recommendations, at the time. Tina [name] recent genetic test results were disclosed to her, as were recommendations warranted by these results. These results and recommendations are discussed in more detail below.  CANCER HISTORY:  Oncology History Overview Note   Cancer Staging  Ductal carcinoma in situ (DCIS) of left breast Staging form: Breast, AJCC 8th Edition - Pathologic stage from 04/25/2021: Stage Unknown (pTis (DCIS), pNX, cM0, G2, ER+, PR+, HER2: Not Assessed) - Signed by Tina Merle, MD on 05/22/2021    Ductal carcinoma in situ (DCIS) of left breast  01/17/2021 Imaging   EXAM: BILATERAL BREAST MRI WITH AND WITHOUT CONTRAST  IMPRESSION: Indeterminate clumped enhancement in the LOWER central LEFT breast warranting tissue diagnosis. Given the appearance, 9 sonographic correlate is unlikely.   Benign LEFT hepatic cyst   01/30/2021 Initial Biopsy   Diagnosis Breast, left, needle core biopsy, lower, central, enhancement marked with barbell clip - ATYPICAL DUCTAL HYPERPLASIA. - FIBROCYSTIC CHANGE AND USUAL DUCTAL HYPERPLASIA.   04/25/2021 Cancer Staging   Staging form: Breast, AJCC 8th Edition - Pathologic stage from 04/25/2021: Stage Unknown (pTis (DCIS), pNX, cM0, G2, ER+, PR+, HER2: Not Assessed) - Signed by Tina Merle, MD on 05/22/2021 Stage prefix: Initial diagnosis Histologic grading system: 3 grade system Residual tumor (R): R0 - None    04/25/2021 Definitive Surgery   FINAL MICROSCOPIC DIAGNOSIS:   A. BREAST, LEFT, LUMPECTOMY:  - Ductal carcinoma in situ, intermediate grade, focally involving a  papillary lesion, see comment  - Resection margins are negative for DCIS -  closest is the inferior  margin at less than 1 mm  - Mild fibrocystic change with usual ductal hyperplasia and apocrine metaplasia  - Biopsy site changes  - See oncology table   ADDENDUM:  PROGNOSTIC INDICATOR RESULTS:  Estrogen Receptor: POSITIVE, 100% STRONG STAINING INTENSITY  Progesterone Receptor: POSITIVE, 100% STRONG STAINING INTENSITY    05/21/2021 Initial Diagnosis   Ductal carcinoma in situ (DCIS) of left breast   07/05/2021 Genetic Testing   Negative hereditary cancer genetic testing: no pathogenic variants detected in [facility] CancerNext-Expanded +RNAinsight Panel.  The report date is July 04, 2021.   The CancerNext-Expanded gene panel offered by [facility] and includes sequencing, rearrangement, and RNA analysis for the following 77 genes: AIP, ALK, APC, ATM, AXIN2, BAP1, BARD1, BLM, BMPR1A, BRCA1, BRCA2, BRIP1, CDC73, CDH1, CDK4, CDKN1B, CDKN2A, CHEK2, CTNNA1, DICER1, FANCC, FH, FLCN, GALNT12, KIF1B, LZTR1, MAX, MEN1, MET, MLH1, MSH2, MSH3, MSH6, MUTYH, NBN, NF1, NF2, NTHL1, PALB2, PHOX2B, PMS2, POT1, PRKAR1A, PTCH1, PTEN, RAD51C, RAD51D, RB1, RECQL, RET, SDHA, SDHAF2, SDHB, SDHC, SDHD, SMAD4, SMARCA4, SMARCB1, SMARCE1, STK11, SUFU, TMEM127, TP53, TSC1, TSC2, VHL and XRCC2 (sequencing and deletion/duplication); EGFR, EGLN1, HOXB13, KIT, MITF, PDGFRA, POLD1, and POLE (sequencing only); EPCAM and GREM1 (deletion/duplication only).      FAMILY HISTORY:  We obtained a detailed, 4-generation family history.  Significant diagnoses are listed below:      Family History  Problem Relation Age of Onset   Uterine cancer Mother 7        uterine   Kidney cancer Mother 2        RCC   Cancer Father 67        parotid   Colon  cancer Maternal Uncle          x2 maternal uncles; dx 72s   Colon cancer Paternal Uncle          dx after 44   Melanoma Maternal Grandfather          mets; d. 4s   Thyroid cancer Paternal Grandfather          d. late 33s      Tina [name] is Tina [name] of  previous family history of genetic testing for hereditary cancer risks. There is no reported Ashkenazi Jewish ancestry. There is no known consanguinity.    GENETIC TEST RESULTS:  The [facility] CancerNext-Expanded +RNAinsight Panel found no pathogenic mutations. The CancerNext-Expanded gene panel offered by [facility] and includes sequencing, rearrangement, and RNA analysis for the following 77 genes: AIP, ALK, APC, ATM, AXIN2, BAP1, BARD1, BLM, BMPR1A, BRCA1, BRCA2, BRIP1, CDC73, CDH1, CDK4, CDKN1B, CDKN2A, CHEK2, CTNNA1, DICER1, FANCC, FH, FLCN, GALNT12, KIF1B, LZTR1, MAX, MEN1, MET, MLH1, MSH2, MSH3, MSH6, MUTYH, NBN, NF1, NF2, NTHL1, PALB2, PHOX2B, PMS2, POT1, PRKAR1A, PTCH1, PTEN, RAD51C, RAD51D, RB1, RECQL, RET, SDHA, SDHAF2, SDHB, SDHC, SDHD, SMAD4, SMARCA4, SMARCB1, SMARCE1, STK11, SUFU, TMEM127, TP53, TSC1, TSC2, VHL and XRCC2 (sequencing and deletion/duplication); EGFR, EGLN1, HOXB13, KIT, MITF, PDGFRA, POLD1, and POLE (sequencing only); EPCAM and GREM1 (deletion/duplication only).   The test report has been scanned into EPIC and is located under the Molecular Pathology section of the Results Review tab.  A portion of the result report is included below for reference. Genetic testing reported out on July 04, 2021.       Even though a pathogenic variant was not identified, possible explanations for the cancer in the family may include: There may be no hereditary risk for cancer in the family. The cancers in Tina [name] and/or her family may be sporadic/familial or due to other genetic and environmental factors. There may be a gene mutation in one of these genes that current testing methods cannot detect but that chance is small. There could be another gene that has not yet been discovered, or that we have not yet tested, that is responsible for the cancer diagnoses in the family.  It is also possible there is a hereditary cause for the cancer in the family that Tina [name] did not  inherit.   Therefore, it is important to remain in touch with cancer genetics in the future so that we can continue to offer Tina [name] the most up to date genetic testing.   ADDITIONAL GENETIC TESTING:  We discussed with Tina [name] that her genetic testing was fairly extensive.  If there are genes identified to increase cancer risk that can be analyzed in the future, we would be happy to discuss and coordinate this testing at that time.    CANCER SCREENING RECOMMENDATIONS:  Tina [name] test result is considered negative (normal).  This means that we have not identified a hereditary cause for her personal history of DCIS at this time.   An individual's cancer risk and medical management are not determined by genetic test results alone. Overall cancer risk assessment incorporates additional factors, including personal medical history, family history, and any available genetic information that may result in a personalized plan for cancer prevention and surveillance. Therefore, it is recommended she continue to follow the cancer management and screening guidelines provided by her oncology and primary healthcare provider.  RECOMMENDATIONS FOR FAMILY MEMBERS:   Since she did not inherit a identifiable mutation in a cancer predisposition gene  included on this panel, her children could not have inherited a known mutation from her in one of these genes. Individuals in this family might be at some increased risk of developing cancer, over the general population risk, due to the family history of cancer.  Individuals in the family should notify their providers of the family history of cancer. We recommend women in this family have a yearly mammogram beginning at age 65, or 54 years younger than the earliest onset of cancer, an annual clinical breast exam, and perform monthly breast self-exams.   First degree relatives of those with colon cancer should receive colonoscopies beginning at age 59, or 10 years  prior to the earliest diagnosis of colon cancer in the family, and receive colonoscopies at least every 5 years, or as recommended by their gastroenterologist.   Other members of the family may still carry a pathogenic variant in one of these genes that Tina [name] did not inherit. Based on the maternal family history of colon and uterine cancer, we recommend her brother and maternal first cousins have genetic counseling and testing. Tina [name] will let us know if we can be of any assistance in coordinating genetic counseling and/or testing for this family member.     FOLLOW-UP:  Lastly, we discussed with Tina [name] that cancer genetics is a rapidly advancing field and it is possible that new genetic tests will be appropriate for her and/or her family members in the future. We encouraged her to remain in contact with cancer genetics on an annual basis so we can update her personal and family histories and let her know of advances in cancer genetics that may benefit this family.   Our contact number was provided. Tina [name] questions were answered to her satisfaction, and she knows she is welcome to call us at anytime with additional questions or concerns.   Elyn Krogh M. Joette Catching, [place], [facility] Genetic Counselor [email or web address] (P) [phone]

## 2021-07-05 NOTE — Telephone Encounter (Signed)
Revealed negative genetic testing.  Discussed that we do not know why she has DCIS or why there is cancer in the family. It could be sporadic/famillial, due to a change in a gene that she did not inherit, due to a different gene that we are not testing, or maybe our current technology may not be able to pick something up.  It will be important for her to keep in contact with genetics to keep up with whether additional testing may be needed.

## 2021-07-08 ENCOUNTER — Encounter: Payer: Self-pay | Admitting: Hematology

## 2021-07-17 ENCOUNTER — Encounter (HOSPITAL_COMMUNITY): Payer: Self-pay

## 2021-07-17 NOTE — Progress Notes (Signed)
° °                                                                                                                                                          °  Patient Name: Tina [name] MRN: [id number] DOB: March 06, 1956 Referring Physician: Donnie Mesa (Profile Not Attached) Date of Service: 06/27/2021 [facility], [place]                                                        End Of Treatment Note  Diagnoses: D05.12-Intraductal carcinoma in situ of left breast  Cancer Staging: Stage 0 Left Breast, Intermediate grade ductal carcinoma in-situ, ER+ / PR+ / Her2 not assessed  Intent: Curative  Radiation Treatment Dates: 06/06/2021 through 06/27/2021 Site Technique Total Dose (Gy) Dose per Fx (Gy) Completed Fx Beam Energies  Breast, Left: Breast_L 3D 42.56/42.56 2.66 16/16 10X   Narrative: The patient tolerated radiation therapy relatively well.   Plan: The patient will follow-up with radiation oncology in 77mo. -----------------------------------  Eppie Gibson, MD

## 2021-08-14 NOTE — Progress Notes (Signed)
GYNECOLOGY  VISIT ?  ?HPI: ?66 y.o.   Married  Caucasian  female   ?[id number] with Patient's last menstrual period was 05/05/2010.   ?here for  U/S check of small right ovarian cysts.  ?She did have a drop of bleeding vaginally last week.  ?None since.  ?She did have an endometrial polyp removed hysteroscopically in 2022.  ? ?Pelvic US done 02/12/21: ?Uterus 6.53 x 5.42 x 3.54 cm.  ?Fibroid 1.46 cm  ?EMS 2.44 mm.  ?Left ovary normal.  ?Right ovary with 2 avascular cysts: 2.29 x 1.68 x 1.99 cm,  1.36 x 1.41 x 1.38 cm.  (No significant change from previous scan.) ?No free fluid.     ? ?CA125 14 on 11/20/20.  ? ?Now on Arimidex for breast cancer.  ? ?GYNECOLOGIC HISTORY: ?Patient's last menstrual period was 05/05/2010. ?Contraception:  PMP ?Menopausal hormone therapy:  None ?Last mammogram:  02-12-21 Left breast Bx proven ADH-surgical consult rec.--04-25-21 Left breast Bx -DCIS ?Last pap smear:   11-20-20 ASCUS Neg HPV ?       ?OB History   ? ? Gravida  ?2  ? Para  ?2  ? Term  ?2  ? Preterm  ?   ? AB  ?   ? Living  ?2  ?  ? ? SAB  ?   ? IAB  ?   ? Ectopic  ?   ? Multiple  ?   ? Live Births  ?   ?   ?  ?  ?    ? ?Patient Active Problem List  ? Diagnosis Date Noted  ? Genetic testing 07/05/2021  ? Family history of colon cancer 06/23/2021  ? Family history of uterine cancer 06/23/2021  ? 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  ? History of right breast cancer 01/10/2020  ? HTN (hypertension) 02/01/2012  ? Other and unspecified hyperlipidemia 02/01/2012  ? ? ?Past Medical History:  ?Diagnosis Date  ? Acid reflux   ? Cancer [facility]) 2002  ? Breast cancer-Lobular carcinoma in situ  ? Family history of colon cancer 06/23/2021  ? Family history of uterine cancer 06/23/2021  ? HTN (hypertension)   ? Hyperlipidemia   ? Hypertension   ? Osteopenia 07/2011  ? t score -1.1 FRAX 13%/0.4%  ? Rosacea   ? Schatzki's ring   ? ? ?Past Surgical History:  ?Procedure Laterality Date  ? BREAST BIOPSY Left   ? Stereo High Risk   ?  BREAST EXCISIONAL BIOPSY Right   ? BREAST EXCISIONAL BIOPSY Left 2018  ? BREAST LUMPECTOMY WITH RADIOACTIVE SEED LOCALIZATION Left 07/22/2016  ? Procedure: LEFT BREAST LUMPECTOMY WITH RADIOACTIVE SEED LOCALIZATION;  Surgeon: Donnie Mesa, MD;  Location: [place];  Service: General;  Laterality: Left;  ? BREAST LUMPECTOMY WITH RADIOACTIVE SEED LOCALIZATION Left 04/25/2021  ? Procedure: LEFT BREAST LUMPECTOMY WITH RADIOACTIVE SEED LOCALIZATION;  Surgeon: Donnie Mesa, MD;  Location: [place];  Service: General;  Laterality: Left;  ? BREAST SURGERY  2004  ? excisional right breast surgery   ? CHOLECYSTECTOMY  2003  ? DILATATION & CURETTAGE/HYSTEROSCOPY WITH MYOSURE N/A 01/22/2021  ? Procedure: DILATATION & CURETTAGE/HYSTEROSCOPY WITH MYOSURE RESECTION OF ENDOMETERIAL POLYP;  Surgeon: Nunzio Cobbs, MD;  Location[facility];  Service: Gynecology;  Laterality: N/A;  ? ? ?Current Outpatient Medications  ?Medication Sig Dispense Refill  ? acetaminophen (TYLENOL) 325 MG tablet Take 650 mg by mouth every 6 (six) hours as needed (for  pain/headaches.).    ? anastrozole (ARIMIDEX) 1 MG tablet Take 1 tablet (1 mg total) by mouth daily. 90 tablet 1  ? Calcium Carb-Cholecalciferol 600-800 MG-UNIT TABS Take 1 tablet by mouth at bedtime.    ? cholecalciferol (VITAMIN D) 1000 units tablet Take 1,000 Units by mouth at bedtime.    ? losartan (COZAAR) 100 MG tablet Take 1 tablet by mouth daily.    ? LOVAZA 1 G capsule Take 2 g by mouth 2 (two) times daily.     ? MIRVASO 0.33 % GEL Apply 1 application topically daily. Applied to face daily in the morning.    ? Multiple Vitamin (MULTIVITAMIN) capsule Take 1 capsule by mouth daily.    ? XIIDRA 5 % SOLN     ? ?No current facility-administered medications for this visit.  ?  ? ?ALLERGIES: Ace inhibitors and Dextromethorphan ? ?Family History  ?Problem Relation Age of Onset  ? Uterine cancer Mother 69  ?     uterine  ? Osteoporosis Mother   ?  Dementia Mother   ? Kidney cancer Mother 93  ?     RCC  ? Stroke Father 68  ? Diabetes Father   ? Hypertension Father   ? Heart disease Father   ?     bypass  ? Cancer Father 72  ?     parotid  ? Diabetes Brother   ?     type 2  ? Colon cancer Maternal Uncle   ?     x2 maternal uncles; dx 42s  ? Colon cancer Paternal Uncle   ?     dx after 74  ? Melanoma Maternal Grandfather   ?     mets; d. 73s  ? Thyroid cancer Paternal Grandfather   ?     d. late 38s  ? Diabetes Son 41  ?     MODY  ? Asthma Son   ? ? ?Social History  ? ?Socioeconomic History  ? Marital status: Married  ?  Spouse name: Not on file  ? Number of children: 2  ? Years of education: Not on file  ? Highest education level: Not on file  ?Occupational History  ? Occupation: PA  ?  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[employer], works PRN  ?Tobacco Use  ? Smoking status: Never  ? Smokeless tobacco: Never  ?Vaping Use  ? Vaping Use: Never used  ?Substance and Sexual Activity  ? Alcohol use: Yes  ?  Comment: Once a month  ? Drug use: Never  ? Sexual activity: Yes  ?  Partners: Male  ?  Birth control/protection: Post-menopausal  ?  Comment: 1st intercourse 10 yo-1 partner  ?Other Topics Concern  ? Not on file  ?Social History Narrative  ? Not on file  ? ?Social Determinants of Health  ? ?Financial Resource Strain: Not on file  ?Food Insecurity: Not on file  ?Transportation Needs: Not on file  ?Physical Activity: Not on file  ?Stress: Not on file  ?Social Connections: Not on file  ?Intimate Partner Violence: Not on file  ? ? ?Review of Systems  See HPI. ? ?PHYSICAL EXAMINATION:   ? ?BP 124/78 (BP Location: Right Arm, Patient Position: Sitting, Cuff Size: Normal)   LMP 05/05/2010     ?General appearance: alert, cooperative and appears stated age ?  ?Pelvic US today ?Uterus 6.02 x 3.24 cm ?2 intramural fibroids:  0.78 cm and 1.09 cm. ?EMS 2.58 mm.  ?Left ovary 2.3 x 1.41 x  0.87 cm.  ?Right ovary 3.49 x 2.1 x 1.78 cm.  2 small avascular cysts:  1.4 x 2.3 cm and 1.4 x 0.8  cm.   No significant change.  ?No free fluid.  ? ?ASSESSMENT ? ?Postmenopausal bleeding.  I suspect atrophy as cause.  ?Hx endometrial polyp.  ?Fibroids.  ?Small stable right ovarian cysts.  ?Breast cancer.  On Arimidex.  ? ?PLAN ? ?Pelvic US images and report reviewed from today as well as from 02/12/21.  ?No EMB needed today.  ?Will check CA125 today.  ?Annual exam in August 2023.  ?Return for any future postmenopausal bleeding.  Would to EMB then.  ?  ?An After Visit Summary was printed and given to the patient. ? ?21 min  total time was spent for this patient encounter, including preparation, face-to-face counseling with the patient, coordination of care, and documentation of the encounter. ? ?  ?

## 2021-08-15 ENCOUNTER — Ambulatory Visit (INDEPENDENT_AMBULATORY_CARE_PROVIDER_SITE_OTHER): Payer: Medicare Other

## 2021-08-15 ENCOUNTER — Ambulatory Visit (INDEPENDENT_AMBULATORY_CARE_PROVIDER_SITE_OTHER): Payer: Medicare Other | Admitting: Obstetrics and Gynecology

## 2021-08-15 ENCOUNTER — Encounter: Payer: Self-pay | Admitting: Obstetrics and Gynecology

## 2021-08-15 VITALS — BP 124/78

## 2021-08-15 DIAGNOSIS — N950 Postmenopausal bleeding: Secondary | ICD-10-CM | POA: Diagnosis not present

## 2021-08-15 DIAGNOSIS — D219 Benign neoplasm of connective and other soft tissue, unspecified: Secondary | ICD-10-CM

## 2021-08-15 DIAGNOSIS — N83201 Unspecified ovarian cyst, right side: Secondary | ICD-10-CM

## 2021-08-16 LAB — CA 125: CA 125: 12 U/mL (ref <35)

## 2021-08-21 ENCOUNTER — Ambulatory Visit: Payer: Self-pay | Admitting: Radiation Oncology

## 2021-08-27 ENCOUNTER — Encounter: Payer: Self-pay | Admitting: Radiation Oncology

## 2021-08-27 ENCOUNTER — Ambulatory Visit
Admission: RE | Admit: 2021-08-27 | Discharge: 2021-08-27 | Disposition: A | Discharge status: Home / Self Care | Payer: Medicare Other | Source: Ambulatory Visit | Attending: Radiation Oncology | Admitting: Radiation Oncology

## 2021-08-27 ENCOUNTER — Other Ambulatory Visit: Payer: Self-pay

## 2021-08-27 VITALS — BP 120/75 | HR 67 | Temp 97.0°F | Resp 18 | Ht 67.5 in | Wt 185.4 lb

## 2021-08-27 DIAGNOSIS — D0512 Intraductal carcinoma in situ of left breast: Secondary | ICD-10-CM | POA: Diagnosis present

## 2021-08-27 DIAGNOSIS — Z170 Estrogen receptor positive status [ER+]: Secondary | ICD-10-CM | POA: Insufficient documentation

## 2021-08-27 DIAGNOSIS — Z79811 Long term (current) use of aromatase inhibitors: Secondary | ICD-10-CM | POA: Diagnosis not present

## 2021-08-27 DIAGNOSIS — Z79899 Other long term (current) drug therapy: Secondary | ICD-10-CM | POA: Diagnosis not present

## 2021-08-27 DIAGNOSIS — Z923 Personal history of irradiation: Secondary | ICD-10-CM | POA: Insufficient documentation

## 2021-08-27 NOTE — Progress Notes (Signed)
?Radiation Oncology         [phone] ?________________________________ ? ?Name: Tina [name] MRN: [id number]  ?Date: 08/27/2021  DOB: [birth year] ? ?Follow-Up Visit Note ? ?Outpatient ? ?CC: Tina [name]., MD  Tina Mesa, MD ? ?Diagnosis and Prior Radiotherapy:  ?  ICD-10-CM   ?1. Ductal carcinoma in situ (DCIS) of left breast  D05.12   ?  ? ?Cancer Staging: Stage 0 Left Breast, Intermediate grade ductal carcinoma in-situ, ER+ / PR+ / Her2 not assessed ? ?Intent: Curative ? ?Radiation Treatment Dates: 06/06/2021 through 06/27/2021 ?Site Technique Total Dose (Gy) Dose per Fx (Gy) Completed Fx Beam Energies  ?Breast, Left: Breast_L 3D 42.56/42.56 2.66 16/16 10X  ? ?CHIEF COMPLAINT: Here for follow-up and surveillance of DCIS ? ?Narrative:  The patient returns today for routine follow-up.  Tina [name] presents today for follow-up after completing radiation to her left breast on 06/27/2021 ? ?Pain: Patient denies ?Skin: States she never experienced any skin changes or concerns during treatment, and skin is back to her baseline before treatment.  ?ROM: Denies any issues or concerns ?Lymphedema: Patient denies ?MedOnc F/U: Survivorship Care Plan with Tina [name] on 10/21/2021 ?Other issues of note: Denies any lingering fatigue and has resumed working. Looking forward to her vacation to [place] this Sunday. Saw GYN earlier this month for postmenopausal bleeding; work-up was negative and will F/U again in August of this year ? ?Pt reports Yes No Comments  ?Tamoxifen _0  _1    ?Letrozole _2  _3    ?Anastrazole _4  _5  Reports an occasional, brief hot flash; but otherwise tolerating well  ?Mammogram _6  Date: TBD _7    ? ?                             ? ?ALLERGIES:  is allergic to ace inhibitors and dextromethorphan. ? ?Meds: ?Current Outpatient Medications  ?Medication Sig Dispense Refill  ? acetaminophen (TYLENOL) 325 MG tablet Take 650 mg by mouth every 6 (six) hours as needed (for pain/headaches.).     ? anastrozole (ARIMIDEX) 1 MG tablet Take 1 tablet (1 mg total) by mouth daily. 90 tablet 1  ? Calcium Carb-Cholecalciferol 600-800 MG-UNIT TABS Take 1 tablet by mouth at bedtime.    ? cholecalciferol (VITAMIN D) 1000 units tablet Take 1,000 Units by mouth at bedtime.    ? losartan (COZAAR) 100 MG tablet Take 1 tablet by mouth daily.    ? LOVAZA 1 G capsule Take 2 g by mouth 2 (two) times daily.     ? MIRVASO 0.33 % GEL Apply 1 application topically daily. Applied to face daily in the morning.    ? Multiple Vitamin (MULTIVITAMIN) capsule Take 1 capsule by mouth daily.    ? XIIDRA 5 % SOLN     ? ?No current facility-administered medications for this encounter.  ? ? ?Physical Findings: ?The patient is in no acute distress. Patient is alert and oriented. ? height is 5' 7.5" (1.715 m) and weight is 185 lb 6 oz (84.1 kg). Her temporal temperature is 97 ?F (36.1 ?C) (abnormal). Her blood pressure is 120/75 and her pulse is 67. Her respiration is 18 and oxygen saturation is 99%. Marland Kitchen    ?Satisfactory skin healing in radiotherapy fields.  ? ? ?Lab Findings: ?Lab Results  ?Component Value Date  ? WBC 5.4 01/22/2021  ? HGB 13.8 01/22/2021  ? HCT 40.3 01/22/2021  ? MCV 89.8 01/22/2021  ? PLT 254 01/22/2021  ? ? ?  Radiographic Findings: ?US PELVIS TRANSVAGINAL NON-OB (TV ONLY) ? ?Result Date: 08/25/2021 ?Indication:  Postmenopausal bleeding and right ovarian cyst follow up. History of right ovarian cysts on 02/12/21:  2.29 x 1.68 x 1.99 cm,  1.36 x 1.41 x 1.38 cm. Known history of uterine fibroid, hysteroscopic polypectomy 01/22/21, and current Arimidex therapy. Pelvic US today Uterus 6.02 x 3.24 cm 2 intramural fibroids:  0.78 cm and 1.09 cm. EMS 2.58 mm. Left ovary 2.3 x 1.41 x 0.87 cm. Right ovary 3.49 x 2.1 x 1.78 cm.  2 small avascular cysts:  1.4 x 2.3 cm and 1.4 x 0.8 cm.   No significant change. No free fluid. Impression:  Stable right ovarian cysts and atrophic appearing endometrium.   ? ?Impression/Plan: Healing well from  radiotherapy to the breast tissue. ? ?Continue skin care with topical Vitamin E Oil and / or lotion for at least 2 more months for further healing.  We discussed the importance of good sun hygiene.  I expect she will have a fantastic time in [place]. ? ?I encouraged her to continue with yearly mammography as appropriate (for intact breast tissue) and followup with medical oncology. I will see her back on an as-needed basis. I have encouraged her to call if she has any issues or concerns in the future. I wished her the very best. ? ?On date of service, in total, I spent 25 minutes on this encounter. Patient was seen in person.  ?_____________________________________ ? ? ?Tina Gibson, MD  ?

## 2021-08-27 NOTE — Progress Notes (Signed)
Tina [name] presents today for follow-up after completing radiation to her left breast on 06/27/2021 ? ?Pain: Patient denies ?Skin: States she never experienced any skin changes or concerns during treatment, and skin is back to her baseline before treatment.  ?ROM: Denies any issues or concerns ?Lymphedema: Patient denies ?MedOnc F/U: Survivorship Care Plan with Hendricks Limes on 10/21/2021 ?Other issues of note: Denies any lingering fatigue and has resumed working. Looking forward to her vacation to [place] this Sunday. Saw GYN earlier this month for postmenopausal bleeding; work-up was negative and will F/U again in August of this year ? ?Pt reports Yes No Comments  ?Tamoxifen '[]'$  '[x]'$    ?Letrozole '[]'$  '[x]'$    ?Anastrazole '[x]'$  '[]'$  Reports an occasional, brief hot flash; but otherwise tolerating well  ?Mammogram '[]'$  Date: TBD '[]'$    ? ? ?

## 2021-10-18 ENCOUNTER — Telehealth: Payer: Self-pay | Admitting: *Deleted

## 2021-10-20 NOTE — Progress Notes (Unsigned)
CLINIC:  Survivorship   REASON FOR VISIT:  Routine follow-up post-treatment for a recent history of breast cancer.  BRIEF ONCOLOGIC HISTORY:  Oncology History Overview Note   Cancer Staging  Ductal carcinoma in situ (DCIS) of left breast Staging form: Breast, AJCC 8th Edition - Pathologic stage from 04/25/2021: Stage Unknown (pTis (DCIS), pNX, cM0, G2, ER+, PR+, HER2: Not Assessed) - Signed by Truitt Merle, MD on 05/22/2021    Ductal carcinoma in situ (DCIS) of left breast  01/17/2021 Imaging   EXAM: BILATERAL BREAST MRI WITH AND WITHOUT CONTRAST  IMPRESSION: Indeterminate clumped enhancement in the LOWER central LEFT breast warranting tissue diagnosis. Given the appearance, 9 sonographic correlate is unlikely.   Benign LEFT hepatic cyst   01/30/2021 Initial Biopsy   Diagnosis Breast, left, needle core biopsy, lower, central, enhancement marked with barbell clip - ATYPICAL DUCTAL HYPERPLASIA. - FIBROCYSTIC CHANGE AND USUAL DUCTAL HYPERPLASIA.   04/25/2021 Cancer Staging   Staging form: Breast, AJCC 8th Edition - Pathologic stage from 04/25/2021: Stage Unknown (pTis (DCIS), pNX, cM0, G2, ER+, PR+, HER2: Not Assessed) - Signed by Truitt Merle, MD on 05/22/2021 Stage prefix: Initial diagnosis Histologic grading system: 3 grade system Residual tumor (R): R0 - None   04/25/2021 Definitive Surgery   FINAL MICROSCOPIC DIAGNOSIS:   A. BREAST, LEFT, LUMPECTOMY:  - Ductal carcinoma in situ, intermediate grade, focally involving a  papillary lesion, see comment  - Resection margins are negative for DCIS - closest is the inferior  margin at less than 1 mm  - Mild fibrocystic change with usual ductal hyperplasia and apocrine metaplasia  - Biopsy site changes  - See oncology table   ADDENDUM:  PROGNOSTIC INDICATOR RESULTS:  Estrogen Receptor: POSITIVE, 100% STRONG STAINING INTENSITY  Progesterone Receptor: POSITIVE, 100% STRONG STAINING INTENSITY    05/21/2021 Initial Diagnosis    Ductal carcinoma in situ (DCIS) of left breast   07/05/2021 Genetic Testing   Negative hereditary cancer genetic testing: no pathogenic variants detected in [facility] CancerNext-Expanded +RNAinsight Panel.  The report date is July 04, 2021.   The CancerNext-Expanded gene panel offered by [facility] and includes sequencing, rearrangement, and RNA analysis for the following 77 genes: AIP, ALK, APC, ATM, AXIN2, BAP1, BARD1, BLM, BMPR1A, BRCA1, BRCA2, BRIP1, CDC73, CDH1, CDK4, CDKN1B, CDKN2A, CHEK2, CTNNA1, DICER1, FANCC, FH, FLCN, GALNT12, KIF1B, LZTR1, MAX, MEN1, MET, MLH1, MSH2, MSH3, MSH6, MUTYH, NBN, NF1, NF2, NTHL1, PALB2, PHOX2B, PMS2, POT1, PRKAR1A, PTCH1, PTEN, RAD51C, RAD51D, RB1, RECQL, RET, SDHA, SDHAF2, SDHB, SDHC, SDHD, SMAD4, SMARCA4, SMARCB1, SMARCE1, STK11, SUFU, TMEM127, TP53, TSC1, TSC2, VHL and XRCC2 (sequencing and deletion/duplication); EGFR, EGLN1, HOXB13, KIT, MITF, PDGFRA, POLD1, and POLE (sequencing only); EPCAM and GREM1 (deletion/duplication only).      INTERVAL HISTORY:  Ms. Lizaola presents to the [facility] today for our initial meeting to review her survivorship care plan detailing her treatment course for breast cancer, as well as monitoring long-term side effects of that treatment, education regarding health maintenance, screening, and overall wellness and health promotion.     Overall, Ms. Wygant reports feeling quite well since completing her radiation therapy approximately 3 months ago.  She ***    REVIEW OF SYSTEMS:  Review of Systems - Oncology Breast: Denies any new nodularity, masses, tenderness, nipple changes, or nipple discharge.      ONCOLOGY TREATMENT TEAM:  1. Surgeon:  Dr. Marland Kitchen at [facility] 2. Medical Oncologist: Dr. Marland Kitchen  3. Radiation Oncologist: Dr. Marland Kitchen    PAST MEDICAL/SURGICAL HISTORY:  Past  Medical History:  Diagnosis Date   Acid reflux    Cancer ([place]) 2002   Breast cancer-Lobular carcinoma in situ   Family  history of colon cancer 06/23/2021   Family history of uterine cancer 06/23/2021   HTN (hypertension)    Hyperlipidemia    Hypertension    Osteopenia 07/2011   t score -1.1 FRAX 13%/0.4%   Rosacea    Schatzki's ring    Past Surgical History:  Procedure Laterality Date   BREAST BIOPSY Left    Stereo High Risk    BREAST EXCISIONAL BIOPSY Right    BREAST EXCISIONAL BIOPSY Left 2018   BREAST LUMPECTOMY WITH RADIOACTIVE SEED LOCALIZATION Left 07/22/2016   Procedure: LEFT BREAST LUMPECTOMY WITH RADIOACTIVE SEED LOCALIZATION;  Surgeon: Donnie Mesa, MD;  Location: [facility];  Service: General;  Laterality: Left;   BREAST LUMPECTOMY WITH RADIOACTIVE SEED LOCALIZATION Left 04/25/2021   Procedure: LEFT BREAST LUMPECTOMY WITH RADIOACTIVE SEED LOCALIZATION;  Surgeon: Donnie Mesa, MD;  Location: [facility];  Service: General;  Laterality: Left;   BREAST SURGERY  2004   excisional right breast surgery    CHOLECYSTECTOMY  2003   [place] N/A 01/22/2021   Procedure: DILATATION & CURETTAGE/HYSTEROSCOPY WITH MYOSURE RESECTION OF ENDOMETERIAL POLYP;  Surgeon: Nunzio Cobbs, MD;  Location: [facility];  Service: Gynecology;  Laterality: N/A;     ALLERGIES:  Allergies  Allergen Reactions   Ace Inhibitors Other (See Comments)   Dextromethorphan Other (See Comments)    "Mental fogginess"     CURRENT MEDICATIONS:  Outpatient Encounter Medications as of 10/21/2021  Medication Sig   acetaminophen (TYLENOL) 325 MG tablet Take 650 mg by mouth every 6 (six) hours as needed (for pain/headaches.).   anastrozole (ARIMIDEX) 1 MG tablet Take 1 tablet (1 mg total) by mouth daily.   Calcium Carb-Cholecalciferol 600-800 MG-UNIT TABS Take 1 tablet by mouth at bedtime.   cholecalciferol (VITAMIN D) 1000 units tablet Take 1,000 Units by mouth at bedtime.   losartan (COZAAR) 100 MG tablet Take 1 tablet by mouth daily.   LOVAZA 1 G  capsule Take 2 g by mouth 2 (two) times daily.    MIRVASO 0.33 % GEL Apply 1 application topically daily. Applied to face daily in the morning.   Multiple Vitamin (MULTIVITAMIN) capsule Take 1 capsule by mouth daily.   XIIDRA 5 % SOLN    No facility-administered encounter medications on file as of 10/21/2021.     ONCOLOGIC FAMILY HISTORY:  Family History  Problem Relation Age of Onset   Uterine cancer Mother 42       uterine   Osteoporosis Mother    Dementia Mother    Kidney cancer Mother 63       [place]   Stroke Father 23   Diabetes Father    Hypertension Father    Heart disease Father        bypass   Cancer Father 8       parotid   Diabetes Brother        type 2   Colon cancer Maternal Uncle        x2 maternal uncles; dx 20s   Colon cancer Paternal Uncle        dx after 77   Melanoma Maternal Grandfather        mets; d. 53s   Thyroid cancer Paternal Grandfather        d. late 38s   Diabetes Son 68  MODY   Asthma Son      GENETIC COUNSELING/TESTING: ***  SOCIAL HISTORY:  Alida Greiner is /single/married/divorced/widowed/separated and lives alone/with her spouse/family/friend in (city), [place].  She has (#) children and they live in (city).  Ms. Mermelstein is currently retired/disabled/working part-time/full-time as ***.  She denies any current or history of tobacco, alcohol, or illicit drug use.     PHYSICAL EXAMINATION:  Vital Signs:  There were no vitals filed for this visit. There were no vitals filed for this visit. General: Well-nourished, well-appearing female in no acute distress.  She is unaccompanied/accompanied in clinic by her ***** today.   HEENT: Head is normocephalic.  Pupils equal and reactive to light. Conjunctivae clear without exudate.  Sclerae anicteric. Oral mucosa is pink, moist.  Oropharynx is pink without lesions or erythema.  Lymph: No cervical, supraclavicular, or infraclavicular lymphadenopathy noted on palpation.   Cardiovascular: Regular rate and rhythm.Marland Kitchen Respiratory: Clear to auscultation bilaterally. Chest expansion symmetric; breathing non-labored.  GI: Abdomen soft and round; non-tender, non-distended. Bowel sounds normoactive.  GU: Deferred.  Neuro: No focal deficits. Steady gait.  Psych: Mood and affect normal and appropriate for situation.  Extremities: No edema. MSK: No focal spinal tenderness to palpation.  Full range of motion in bilateral upper extremities Skin: Warm and dry.  LABORATORY DATA:  None for this visit.  DIAGNOSTIC IMAGING:  None for this visit.      ASSESSMENT AND PLAN:  Ms.. Geck is a pleasant 66 y.o. female with Stage *** right/left breast invasive ductal carcinoma, ER+/PR+/HER2-, diagnosed in (date), treated with lumpectomy, adjuvant radiation therapy, and anti-estrogen therapy with *** beginning in (date).  She presents to the Survivorship Clinic for our initial meeting and routine follow-up post-completion of treatment for breast cancer.    1. Stage *** right/left breast cancer:  Ms. Landers is continuing to recover from definitive treatment for breast cancer. She will follow-up with her medical oncologist, Dr. Ross Ludwig in (month) /2017 with history and physical exam per surveillance protocol.  She will continue her anti-estrogen therapy with (drug). Thus far, she is tolerating the *** well, with minimal side effects. She was instructed to make Dr. Lindi Adie or myself aware if she begins to experience any worsening side effects of the medication and I could see her back in clinic to help manage those side effects, as needed. Though the incidence is low, there is an associated risk of endometrial cancer with anti-estrogen therapies like Tamoxifen.  Ms. Zobel was encouraged to contact Dr. Carrington Clamp or myself with any vaginal bleeding while taking Tamoxifen. Other side effects of Tamoxifen were again reviewed with her as well. Today, a comprehensive  survivorship care plan and treatment summary was reviewed with the patient today detailing her breast cancer diagnosis, treatment course, potential late/long-term effects of treatment, appropriate follow-up care with recommendations for the future, and patient education resources.  A copy of this summary, along with a letter will be sent to the patient's primary care provider via mail/fax/In Basket message after today's visit.    #. Problem(s) at Visit______________  #. Bone health:  Given Ms. Dibbern's age/history of breast cancer and her current treatment regimen including anti-estrogen therapy with _______, she is at risk for bone demineralization.  Her last DEXA scan was **/**/20**, which showed (results).***  In the meantime, she was encouraged to increase her consumption of foods rich in calcium, as well as increase her weight-bearing activities.  She was given education on specific activities to promote bone health.  #.  Cancer screening:  Due to Ms. Gabay's history and her age, she should receive screening for skin cancers, colon cancer, and gynecologic cancers.  The information and recommendations are listed on the patient's comprehensive care plan/treatment summary and were reviewed in detail with the patient.    #. Health maintenance and wellness promotion: Ms. Frysinger was encouraged to consume 5-7 servings of fruits and vegetables per day. We reviewed the "Nutrition Rainbow" handout, as well as the handout "Take Control of Your Health and Reduce Your Cancer Risk" from the [facility].  She was also encouraged to engage in moderate to vigorous exercise for 30 minutes per day most days of the week. We discussed the [employer] fitness program, which is designed for cancer survivors to help them become more physically fit after cancer treatments.  She was instructed to limit her alcohol consumption and continue to abstain from tobacco use/***was encouraged stop smoking.     #.  Support services/counseling: It is not uncommon for this period of the patient's cancer care trajectory to be one of many emotions and stressors.  We discussed an opportunity for her to participate in the next session of [facility] ("Finding Your New Normal") support group series designed for patients after they have completed treatment.   Ms. Payer was encouraged to take advantage of our many other support services programs, support groups, and/or counseling in coping with her new life as a cancer survivor after completing anti-cancer treatment.  She was offered support today through active listening and expressive supportive counseling.  She was given information regarding our available services and encouraged to contact me with any questions or for help enrolling in any of our support group/programs.    Dispo:   -Return to cancer center ***  -Mammogram due in *** -Follow up with surgery *** -She is welcome to return back to the Survivorship Clinic at any time; no additional follow-up needed at this time.  -Consider referral back to survivorship as a long-term survivor for continued surveillance  A total of (30) minutes of face-to-face time was spent with this patient with greater than 50% of that time in counseling and care-coordination.   Cira Rue, NP Survivorship Program [place] [phone]   Note: PRIMARY CARE PROVIDER Ginger Organ., MD [phone]

## 2021-10-21 ENCOUNTER — Other Ambulatory Visit: Payer: Self-pay

## 2021-10-21 ENCOUNTER — Encounter: Payer: Self-pay | Admitting: Nurse Practitioner

## 2021-10-21 ENCOUNTER — Inpatient Hospital Stay: Payer: Medicare Other | Attending: Nurse Practitioner | Admitting: Nurse Practitioner

## 2021-10-21 VITALS — BP 119/70 | HR 61 | Temp 98.2°F | Resp 17 | Ht 67.5 in | Wt 187.1 lb

## 2021-10-21 DIAGNOSIS — C50912 Malignant neoplasm of unspecified site of left female breast: Secondary | ICD-10-CM | POA: Insufficient documentation

## 2021-10-21 DIAGNOSIS — Z923 Personal history of irradiation: Secondary | ICD-10-CM | POA: Diagnosis not present

## 2021-10-21 DIAGNOSIS — Z170 Estrogen receptor positive status [ER+]: Secondary | ICD-10-CM | POA: Insufficient documentation

## 2021-10-21 DIAGNOSIS — Z79811 Long term (current) use of aromatase inhibitors: Secondary | ICD-10-CM | POA: Diagnosis not present

## 2021-10-21 DIAGNOSIS — D0512 Intraductal carcinoma in situ of left breast: Secondary | ICD-10-CM | POA: Insufficient documentation

## 2021-10-21 MED ORDER — ANASTROZOLE 1 MG PO TABS: 1.0000 mg | ORAL_TABLET | Freq: Every day | ORAL | 3 refills | Status: DC

## 2021-11-06 ENCOUNTER — Telehealth: Payer: Self-pay

## 2021-11-06 NOTE — Telephone Encounter (Signed)
This nurse received a message from patient stating that she saw NP Kalman Shan on 10/21/21 and was advised if skin itching worsens to hold Anastrozole to see if itching improves.  Patient states that she did have to stop taking the Anastrozole for a while and the itching has subsided.  She would like to speak with the provider on what the alternative medication will be.  Patient states as a reminder she also cannot take Tamoxifen.  This information has been forwarded to the provider.

## 2021-11-07 ENCOUNTER — Other Ambulatory Visit: Payer: Self-pay | Admitting: Nurse Practitioner

## 2021-11-07 MED ORDER — EXEMESTANE 25 MG PO TABS: 25.0000 mg | ORAL_TABLET | Freq: Every day | ORAL | 3 refills | Status: DC

## 2021-11-27 ENCOUNTER — Ambulatory Visit
Admission: RE | Admit: 2021-11-27 | Discharge: 2021-11-27 | Disposition: A | Discharge status: Home / Self Care | Payer: Medicare Other | Source: Ambulatory Visit | Attending: Nurse Practitioner | Admitting: Nurse Practitioner

## 2021-11-27 DIAGNOSIS — D0512 Intraductal carcinoma in situ of left breast: Secondary | ICD-10-CM

## 2021-11-27 HISTORY — DX: Personal history of irradiation: Z92.3

## 2021-12-31 ENCOUNTER — Other Ambulatory Visit: Payer: Self-pay | Admitting: Surgery

## 2021-12-31 DIAGNOSIS — D0512 Intraductal carcinoma in situ of left breast: Secondary | ICD-10-CM

## 2022-01-08 ENCOUNTER — Other Ambulatory Visit: Payer: Self-pay | Admitting: Nurse Practitioner

## 2022-01-17 ENCOUNTER — Other Ambulatory Visit: Payer: Self-pay

## 2022-01-17 DIAGNOSIS — D0512 Intraductal carcinoma in situ of left breast: Secondary | ICD-10-CM

## 2022-01-19 ENCOUNTER — Ambulatory Visit
Admission: RE | Admit: 2022-01-19 | Discharge: 2022-01-19 | Disposition: A | Discharge status: Home / Self Care | Payer: Medicare Other | Source: Ambulatory Visit | Attending: Surgery | Admitting: Surgery

## 2022-01-19 DIAGNOSIS — D0512 Intraductal carcinoma in situ of left breast: Secondary | ICD-10-CM

## 2022-01-19 MED ORDER — GADOBUTROL 1 MMOL/ML IV SOLN
8.0000 mL | Freq: Once | INTRAVENOUS | Status: AC | PRN
Start: 2022-01-19 — End: 2022-01-19
  Administered 2022-01-19: 8 mL via INTRAVENOUS

## 2022-01-20 ENCOUNTER — Encounter: Payer: Self-pay | Admitting: Hematology

## 2022-01-20 ENCOUNTER — Inpatient Hospital Stay: Payer: Medicare Other | Attending: Nurse Practitioner | Admitting: Hematology

## 2022-01-20 ENCOUNTER — Inpatient Hospital Stay: Payer: Medicare Other

## 2022-01-20 ENCOUNTER — Other Ambulatory Visit: Payer: Self-pay

## 2022-01-20 VITALS — BP 117/70 | HR 62 | Temp 98.4°F | Resp 16 | Ht 67.5 in | Wt 190.3 lb

## 2022-01-20 DIAGNOSIS — Z79811 Long term (current) use of aromatase inhibitors: Secondary | ICD-10-CM | POA: Diagnosis not present

## 2022-01-20 DIAGNOSIS — D0512 Intraductal carcinoma in situ of left breast: Secondary | ICD-10-CM | POA: Diagnosis not present

## 2022-01-20 DIAGNOSIS — Z86000 Personal history of in-situ neoplasm of breast: Secondary | ICD-10-CM | POA: Insufficient documentation

## 2022-01-20 LAB — CMP (CANCER CENTER ONLY)
ALT: 20 U/L (ref 0–44)
AST: 19 U/L (ref 15–41)
Albumin: 4.2 g/dL (ref 3.5–5.0)
Alkaline Phosphatase: 65 U/L (ref 38–126)
Anion gap: 6 (ref 5–15)
BUN: 15 mg/dL (ref 8–23)
CO2: 28 mmol/L (ref 22–32)
Calcium: 10 mg/dL (ref 8.9–10.3)
Chloride: 105 mmol/L (ref 98–111)
Creatinine: 0.93 mg/dL (ref 0.44–1.00)
GFR, Estimated: >60 mL/min (ref >60)
Glucose, Bld: 139 mg/dL — ABNORMAL HIGH (ref 70–99)
Potassium: 3.5 mmol/L (ref 3.5–5.1)
Sodium: 139 mmol/L (ref 135–145)
Total Bilirubin: 0.4 mg/dL (ref 0.3–1.2)
Total Protein: 6.9 g/dL (ref 6.5–8.1)

## 2022-01-20 LAB — CBC WITH DIFFERENTIAL (CANCER CENTER ONLY)
Abs Immature Granulocytes: 0.01 10*3/uL (ref 0.00–0.07)
Basophils Absolute: 0 10*3/uL (ref 0.0–0.1)
Basophils Relative: 1 %
Eosinophils Absolute: 0.1 10*3/uL (ref 0.0–0.5)
Eosinophils Relative: 3 %
HCT: 38.7 % (ref 36.0–46.0)
Hemoglobin: 13.3 g/dL (ref 12.0–15.0)
Immature Granulocytes: 0 %
Lymphocytes Relative: 27 %
Lymphs Abs: 1.2 10*3/uL (ref 0.7–4.0)
MCH: 30.9 pg (ref 26.0–34.0)
MCHC: 34.4 g/dL (ref 30.0–36.0)
MCV: 89.8 fL (ref 80.0–100.0)
Monocytes Absolute: 0.4 10*3/uL (ref 0.1–1.0)
Monocytes Relative: 8 %
Neutro Abs: 2.7 10*3/uL (ref 1.7–7.7)
Neutrophils Relative %: 61 %
Platelet Count: 206 10*3/uL (ref 150–400)
RBC: 4.31 MIL/uL (ref 3.87–5.11)
RDW: 13 % (ref 11.5–15.5)
WBC Count: 4.3 10*3/uL (ref 4.0–10.5)
nRBC: 0 % (ref 0.0–0.2)

## 2022-01-20 NOTE — Progress Notes (Signed)
[facility]   Telephone:(336) [phone] Fax:(336) [phone]   Clinic Follow up Note   Patient Care Team: Ginger Organ., MD as PCP - General (Internal Medicine) Mauro Kaufmann, RN as Oncology Nurse Navigator Rockwell Germany, RN as Oncology Nurse Navigator Truitt Merle, MD as Consulting Physician (Hematology) Donnie Mesa, MD as Consulting Physician (General Surgery) Alla Feeling, NP as Nurse Practitioner (Nurse Practitioner)  Date of Service:  01/20/2022  CHIEF COMPLAINT: f/u of left breast DCIS  CURRENT THERAPY:  Anastrozole, starting 07/2021  ASSESSMENT & PLAN:  Tina [name] is a 66 y.o. post-menopausal female with   left breast DCIS, history of LCIS and [place]  -Diagnosed in September 2022 -Status post left lumpectomy and adjuvant radiation -Tried anastrozole and exemestane, developed allergy reaction with skin rashes on both drugs -She is not a good candidate for tamoxifen due to her previous endometrial thickening, family history of endometrial cancer. -She will continue annual screening mammogram and screening breast MRI.  Breast MRI from yesterday was negative.  I reviewed with her.   Plan -she has stopped exemestane due to skin rashes -She will follow-up with her primary care physician and Dr. Georgette Dover for breast cancer screening -I will see her as needed    SUMMARY OF ONCOLOGIC HISTORY: Oncology History Overview Note   Cancer Staging  Ductal carcinoma in situ (DCIS) of left breast Staging form: Breast, AJCC 8th Edition - Pathologic stage from 04/25/2021: Stage Unknown (pTis (DCIS), pNX, cM0, G2, ER+, PR+, HER2: Not Assessed) - Signed by Truitt Merle, MD on 05/22/2021    Ductal carcinoma in situ (DCIS) of left breast  01/17/2021 Imaging   EXAM: BILATERAL BREAST MRI WITH AND WITHOUT CONTRAST  IMPRESSION: Indeterminate clumped enhancement in the LOWER central LEFT breast warranting tissue diagnosis. Given the appearance, 9 sonographic correlate  is unlikely.   Benign LEFT hepatic cyst   01/30/2021 Initial Biopsy   Diagnosis Breast, left, needle core biopsy, lower, central, enhancement marked with barbell clip - ATYPICAL DUCTAL HYPERPLASIA. - FIBROCYSTIC CHANGE AND USUAL DUCTAL HYPERPLASIA.   04/25/2021 Cancer Staging   Staging form: Breast, AJCC 8th Edition - Pathologic stage from 04/25/2021: Stage Unknown (pTis (DCIS), pNX, cM0, G2, ER+, PR+, HER2: Not Assessed) - Signed by Truitt Merle, MD on 05/22/2021 Stage prefix: Initial diagnosis Histologic grading system: 3 grade system Residual tumor (R): R0 - None   04/25/2021 Definitive Surgery   FINAL MICROSCOPIC DIAGNOSIS:   A. BREAST, LEFT, LUMPECTOMY:  - Ductal carcinoma in situ, intermediate grade, focally involving a  papillary lesion, see comment  - Resection margins are negative for DCIS - closest is the inferior  margin at less than 1 mm  - Mild fibrocystic change with usual ductal hyperplasia and apocrine metaplasia  - Biopsy site changes  - See oncology table   ADDENDUM:  PROGNOSTIC INDICATOR RESULTS:  Estrogen Receptor: POSITIVE, 100% STRONG STAINING INTENSITY  Progesterone Receptor: POSITIVE, 100% STRONG STAINING INTENSITY    05/21/2021 Initial Diagnosis   Ductal carcinoma in situ (DCIS) of left breast   06/06/2021 - 06/27/2021 Radiation Therapy   Radiation Treatment Dates: 06/06/2021 through 06/27/2021 Site Technique Total Dose (Gy) Dose per Fx (Gy) Completed Fx Beam Energies  Breast, Left: Breast_L 3D 42.56/42.56 2.66 16/16 10X       07/05/2021 Genetic Testing   Negative hereditary cancer genetic testing: no pathogenic variants detected in [facility] CancerNext-Expanded +RNAinsight Panel.  The report date is July 04, 2021.   The CancerNext-Expanded gene panel offered by  [facility] and includes sequencing, rearrangement, and RNA analysis for the following 77 genes: AIP, ALK, APC, ATM, AXIN2, BAP1, BARD1, BLM, BMPR1A, BRCA1, BRCA2, BRIP1, CDC73, CDH1, CDK4,  CDKN1B, CDKN2A, CHEK2, CTNNA1, DICER1, FANCC, FH, FLCN, GALNT12, KIF1B, LZTR1, MAX, MEN1, MET, MLH1, MSH2, MSH3, MSH6, MUTYH, NBN, NF1, NF2, NTHL1, PALB2, PHOX2B, PMS2, POT1, PRKAR1A, PTCH1, PTEN, RAD51C, RAD51D, RB1, RECQL, RET, SDHA, SDHAF2, SDHB, SDHC, SDHD, SMAD4, SMARCA4, SMARCB1, SMARCE1, STK11, SUFU, TMEM127, TP53, TSC1, TSC2, VHL and XRCC2 (sequencing and deletion/duplication); EGFR, EGLN1, HOXB13, KIT, MITF, PDGFRA, POLD1, and POLE (sequencing only); EPCAM and GREM1 (deletion/duplication only).    08/2021 -  Anti-estrogen oral therapy   Anastrozole 1 mg daily   10/21/2021 Survivorship   SCP delivered by Cira Rue, NP      INTERVAL HISTORY:  Tina [name] is here for a follow up of DCIS. She was last seen by me on 05/22/21 and by NP Lacie for survivorship in the interim. She presents to the clinic alone.  She was started on exemestane after her visit with Lacie, but she developed diffuse blistery skin rash from the medicine, and stopped.  She is clinically doing well, no concerns of her breast.  All other systems were reviewed with the patient and are negative.  MEDICAL HISTORY:  Past Medical History:  Diagnosis Date   Acid reflux    Cancer ([place]) 2002   Breast cancer-Lobular carcinoma in situ   Family history of colon cancer 06/23/2021   Family history of uterine cancer 06/23/2021   HTN (hypertension)    Hyperlipidemia    Hypertension    Osteopenia 07/2011   t score -1.1 FRAX 13%/0.4%   Personal history of radiation therapy    Rosacea    Schatzki's ring     SURGICAL HISTORY: Past Surgical History:  Procedure Laterality Date   BREAST BIOPSY Left 04/24/2021   Stereo High Risk    BREAST BIOPSY Left 01/30/2021   BREAST EXCISIONAL BIOPSY Right    BREAST EXCISIONAL BIOPSY Left 2018   BREAST LUMPECTOMY Left 04/25/2021   BREAST LUMPECTOMY WITH RADIOACTIVE SEED LOCALIZATION Left 07/22/2016   Procedure: LEFT BREAST LUMPECTOMY WITH RADIOACTIVE SEED LOCALIZATION;   Surgeon: Donnie Mesa, MD;  Location: [facility];  Service: General;  Laterality: Left;   BREAST LUMPECTOMY WITH RADIOACTIVE SEED LOCALIZATION Left 04/25/2021   Procedure: LEFT BREAST LUMPECTOMY WITH RADIOACTIVE SEED LOCALIZATION;  Surgeon: Donnie Mesa, MD;  Location: [facility];  Service: General;  Laterality: Left;   BREAST SURGERY  2004   excisional right breast surgery    CHOLECYSTECTOMY  2003   [place] N/A 01/22/2021   Procedure: DILATATION & CURETTAGE/HYSTEROSCOPY WITH MYOSURE RESECTION OF ENDOMETERIAL POLYP;  Surgeon: Nunzio Cobbs, MD;  Location: [facility];  Service: Gynecology;  Laterality: N/A;    I have reviewed the social history and family history with the patient and they are unchanged from previous note.  ALLERGIES:  is allergic to ace inhibitors and dextromethorphan.  MEDICATIONS:  Current Outpatient Medications  Medication Sig Dispense Refill   acetaminophen (TYLENOL) 325 MG tablet Take 650 mg by mouth every 6 (six) hours as needed (for pain/headaches.).     Calcium Carb-Cholecalciferol 600-800 MG-UNIT TABS Take 1 tablet by mouth at bedtime.     cholecalciferol (VITAMIN D) 1000 units tablet Take 1,000 Units by mouth at bedtime.     exemestane (AROMASIN) 25 MG tablet TAKE 1 TABLET BY MOUTH DAILY  AFTER BREAKFAST 90 tablet 3  losartan (COZAAR) 100 MG tablet Take 1 tablet by mouth daily.     LOVAZA 1 G capsule Take 2 g by mouth 2 (two) times daily.      MIRVASO 0.33 % GEL Apply 1 application topically daily. Applied to face daily in the morning.     Multiple Vitamin (MULTIVITAMIN) capsule Take 1 capsule by mouth daily.     XIIDRA 5 % SOLN      No current facility-administered medications for this visit.    PHYSICAL EXAMINATION: ECOG PERFORMANCE STATUS: 0 - Asymptomatic  Vitals:   01/20/22 1445  BP: 117/70  Pulse: 62  Resp: 16  Temp: 98.4 F (36.9 C)  SpO2: 99%   Wt Readings  from Last 3 Encounters:  01/20/22 190 lb 4.8 oz (86.3 kg)  10/21/21 187 lb 1.6 oz (84.9 kg)  08/27/21 185 lb 6 oz (84.1 kg)     GENERAL:alert, no distress and comfortable SKIN: skin color, texture, turgor are normal, she has some mild skin hyperpigmentation from previous skin rash. EYES: normal, Conjunctiva are pink and non-injected, sclera clear NECK: supple, thyroid normal size, non-tender, without nodularity LYMPH:  no palpable lymphadenopathy in the cervical, axillary LUNGS: clear to auscultation and percussion with normal breathing effort HEART: regular rate & rhythm and no murmurs and no lower extremity edema ABDOMEN:abdomen soft, non-tender and normal bowel sounds Musculoskeletal:no cyanosis of digits and no clubbing  NEURO: alert & oriented x 3 with fluent speech, no focal motor/sensory deficits BREAST: No palpable mass, nodules or adenopathy bilaterally. Breast exam benign.   LABORATORY DATA:  I have reviewed the data as listed    Latest Ref Rng & Units 01/20/2022    2:10 PM 01/22/2021    9:45 AM 12/16/2016   10:31 PM  CBC  WBC 4.0 - 10.5 K/uL 4.3  5.4  6.5   Hemoglobin 12.0 - 15.0 g/dL 13.3  13.8  13.5   Hematocrit 36.0 - 46.0 % 38.7  40.3  38.7   Platelets 150 - 400 K/uL 206  254  214         Latest Ref Rng & Units 01/20/2022    2:10 PM 01/22/2021    9:45 AM 12/16/2016   10:31 PM  CMP  Glucose 70 - 99 mg/dL 139  104  152   BUN 8 - 23 mg/dL _0 Creatinine 0.44 - 1.00 mg/dL 0.93  0.77  0.89   Sodium 135 - 145 mmol/L 139  141  141   Potassium 3.5 - 5.1 mmol/L 3.5  4.1  3.3   Chloride 98 - 111 mmol/L 105  107  107   CO2 22 - 32 mmol/L _1 Calcium 8.9 - 10.3 mg/dL 10.0  9.3  9.7   Total Protein 6.5 - 8.1 g/dL 6.9     Total Bilirubin 0.3 - 1.2 mg/dL 0.4     Alkaline Phos 38 - 126 U/L 65     AST 15 - 41 U/L 19     ALT 0 - 44 U/L 20         RADIOGRAPHIC STUDIES: I have personally reviewed the radiological images as listed and agreed with the  findings in the report. MR BREAST BILATERAL W WO CONTRAST INC CAD  Result Date: 01/20/2022 CLINICAL DATA:  66 year old female with history of left breast lumpectomy in December of 2022 with final pathology indicating intermediate grade DCIS. The margins were clear, however the inferior margin was  less than 1 mm. She also has history of LCIS status post excisional biopsy in the left breast which was diagnosed in 2003. EXAM: BILATERAL BREAST MRI WITH AND WITHOUT CONTRAST TECHNIQUE: Multiplanar, multisequence MR images of both breasts were obtained prior to and following the intravenous administration of 8 ml of Gadavist Three-dimensional MR images were rendered by post-processing of the original MR data on an independent workstation. The three-dimensional MR images were interpreted, and findings are reported in the following complete MRI report for this study. Three dimensional images were evaluated at the independent interpreting workstation using the DynaCAD thin client. COMPARISON:  Previous exam(s). FINDINGS: Breast composition: c. Heterogeneous fibroglandular tissue. Background parenchymal enhancement: Moderate. Right breast: No mass or abnormal enhancement. Left breast: No mass or abnormal enhancement. Lymph nodes: No abnormal appearing lymph nodes. Ancillary findings: A T2 bright nonenhancing cyst is again noted in the liver. IMPRESSION: No MRI evidence of malignancy in the bilateral breasts. RECOMMENDATION: 1.  Annual screening mammography is due in in July of 2024. 2.  MRI is suggested in 1 year. BI-RADS CATEGORY  1: Negative. Electronically Signed   By: Ammie Ferrier M.D.   On: 01/20/2022 10:54      No orders of the defined types were placed in this encounter.  All questions were answered. The patient knows to call the clinic with any problems, questions or concerns. No barriers to learning was detected. The total time spent in the appointment was 25 minutes.     Truitt Merle, MD 01/20/2022    I, Wilburn Mylar, am acting as scribe for Truitt Merle, MD.   I have reviewed the above documentation for accuracy and completeness, and I agree with the above.

## 2022-01-21 NOTE — Progress Notes (Signed)
Please call the patient and let them know that their MRI was normal

## 2022-01-23 NOTE — Progress Notes (Unsigned)
66 y.o. [id number] Married {Race/ethnicity:17218} female here for annual breast and pelvic exam.    PCP: Gwenevere Ghazi. Brigitte Pulse, MD   Patient's last menstrual period was 05/05/2010.           Sexually active: {yes no:314532}  The current method of family planning is {contraception:315051}.    Exercising: {yes no:314532}  {types:19826} Smoker:  {YES NO:22349}  Health Maintenance: Pap:  *** History of abnormal Pap:  {YES NO:22349} MMG:  *** Colonoscopy:  *** BMD:   ***  Result  *** TDaP:  *** Gardasil:   {YES NO:22349} HIV: Hep C: Screening Labs:  Hb today: ***, Urine today: ***   reports that she has never smoked. She has never used smokeless tobacco. She reports current alcohol use. She reports that she does not use drugs.  Past Medical History:  Diagnosis Date   Acid reflux    Cancer ([place]) 2002   Breast cancer-Lobular carcinoma in situ   Family history of colon cancer 06/23/2021   Family history of uterine cancer 06/23/2021   HTN (hypertension)    Hyperlipidemia    Hypertension    Osteopenia 07/2011   t score -1.1 FRAX 13%/0.4%   Personal history of radiation therapy    Rosacea    Schatzki's ring     Past Surgical History:  Procedure Laterality Date   BREAST BIOPSY Left 04/24/2021   Stereo High Risk    BREAST BIOPSY Left 01/30/2021   BREAST EXCISIONAL BIOPSY Right    BREAST EXCISIONAL BIOPSY Left 2018   BREAST LUMPECTOMY Left 04/25/2021   BREAST LUMPECTOMY WITH RADIOACTIVE SEED LOCALIZATION Left 07/22/2016   Procedure: LEFT BREAST LUMPECTOMY WITH RADIOACTIVE SEED LOCALIZATION;  Surgeon: Donnie Mesa, MD;  Location: [facility];  Service: General;  Laterality: Left;   BREAST LUMPECTOMY WITH RADIOACTIVE SEED LOCALIZATION Left 04/25/2021   Procedure: LEFT BREAST LUMPECTOMY WITH RADIOACTIVE SEED LOCALIZATION;  Surgeon: Donnie Mesa, MD;  Location: [facility];  Service: General;  Laterality: Left;   BREAST SURGERY  2004   excisional right breast surgery     CHOLECYSTECTOMY  2003   [place] N/A 01/22/2021   Procedure: DILATATION & CURETTAGE/HYSTEROSCOPY WITH MYOSURE RESECTION OF ENDOMETERIAL POLYP;  Surgeon: Nunzio Cobbs, MD;  Location: [facility];  Service: Gynecology;  Laterality: N/A;    Current Outpatient Medications  Medication Sig Dispense Refill   acetaminophen (TYLENOL) 325 MG tablet Take 650 mg by mouth every 6 (six) hours as needed (for pain/headaches.).     Calcium Carb-Cholecalciferol 600-800 MG-UNIT TABS Take 1 tablet by mouth at bedtime.     cholecalciferol (VITAMIN D) 1000 units tablet Take 1,000 Units by mouth at bedtime.     exemestane (AROMASIN) 25 MG tablet TAKE 1 TABLET BY MOUTH DAILY  AFTER BREAKFAST 90 tablet 3   losartan (COZAAR) 100 MG tablet Take 1 tablet by mouth daily.     LOVAZA 1 G capsule Take 2 g by mouth 2 (two) times daily.      MIRVASO 0.33 % GEL Apply 1 application topically daily. Applied to face daily in the morning.     Multiple Vitamin (MULTIVITAMIN) capsule Take 1 capsule by mouth daily.     XIIDRA 5 % SOLN      No current facility-administered medications for this visit.    Family History  Problem Relation Age of Onset   Uterine cancer Mother 55       uterine   Osteoporosis Mother  Dementia Mother    Kidney cancer Mother 46       [place]   Stroke Father 43   Diabetes Father    Hypertension Father    Heart disease Father        bypass   Cancer Father 84       parotid   Diabetes Brother        type 2   Colon cancer Maternal Uncle        x2 maternal uncles; dx 69s   Colon cancer Paternal Uncle        dx after 17   Melanoma Maternal Grandfather        mets; d. 60s   Thyroid cancer Paternal Grandfather        d. late 37s   Diabetes Son 68       MODY   Asthma Son     Review of Systems  Exam:   LMP 05/05/2010     General appearance: alert, cooperative and appears stated age Head: normocephalic, without obvious  abnormality, atraumatic Neck: no adenopathy, supple, symmetrical, trachea midline and thyroid normal to inspection and palpation Lungs: clear to auscultation bilaterally Breasts: normal appearance, no masses or tenderness, No nipple retraction or dimpling, No nipple discharge or bleeding, No axillary adenopathy Heart: regular rate and rhythm Abdomen: soft, non-tender; no masses, no organomegaly Extremities: extremities normal, atraumatic, no cyanosis or edema Skin: skin color, texture, turgor normal. No rashes or lesions Lymph nodes: cervical, supraclavicular, and axillary nodes normal. Neurologic: grossly normal  Pelvic: External genitalia:  no lesions              No abnormal inguinal nodes palpated.              Urethra:  normal appearing urethra with no masses, tenderness or lesions              Bartholins and Skenes: normal                 Vagina: normal appearing vagina with normal color and discharge, no lesions              Cervix: no lesions              Pap taken: {yes no:314532} Bimanual Exam:  Uterus:  normal size, contour, position, consistency, mobility, non-tender              Adnexa: no mass, fullness, tenderness              Rectal exam: {yes no:314532}.  Confirms.              Anus:  normal sphincter tone, no lesions  Chaperone was present for exam:  ***  Assessment:   Well woman visit with gynecologic exam.   Plan: Mammogram screening discussed. Self breast awareness reviewed. Pap and HR HPV as above. Guidelines for Calcium, Vitamin D, regular exercise program including cardiovascular and weight bearing exercise.   Follow up annually and prn.   Additional counseling given.  {yes [id number]. _______ minutes face to face time of which over 50% was spent in counseling.    After visit summary provided.

## 2022-01-27 ENCOUNTER — Encounter: Payer: Self-pay | Admitting: Obstetrics and Gynecology

## 2022-01-27 ENCOUNTER — Ambulatory Visit (INDEPENDENT_AMBULATORY_CARE_PROVIDER_SITE_OTHER): Payer: Medicare Other | Admitting: Obstetrics and Gynecology

## 2022-01-27 VITALS — BP 122/76 | HR 64 | Ht 66.75 in | Wt 187.0 lb

## 2022-01-27 DIAGNOSIS — Z01419 Encounter for gynecological examination (general) (routine) without abnormal findings: Secondary | ICD-10-CM

## 2022-01-27 DIAGNOSIS — Z9289 Personal history of other medical treatment: Secondary | ICD-10-CM | POA: Diagnosis not present

## 2022-01-27 DIAGNOSIS — D0512 Intraductal carcinoma in situ of left breast: Secondary | ICD-10-CM

## 2022-01-27 DIAGNOSIS — Z853 Personal history of malignant neoplasm of breast: Secondary | ICD-10-CM

## 2022-01-27 DIAGNOSIS — Z9189 Other specified personal risk factors, not elsewhere classified: Secondary | ICD-10-CM | POA: Diagnosis not present

## 2022-01-27 DIAGNOSIS — N83201 Unspecified ovarian cyst, right side: Secondary | ICD-10-CM

## 2022-01-27 NOTE — Patient Instructions (Signed)

## 2022-06-19 ENCOUNTER — Other Ambulatory Visit: Payer: Self-pay | Admitting: Gastroenterology

## 2022-06-19 DIAGNOSIS — R1310 Dysphagia, unspecified: Secondary | ICD-10-CM

## 2022-07-14 ENCOUNTER — Other Ambulatory Visit: Payer: Medicare Other

## 2022-07-28 ENCOUNTER — Ambulatory Visit
Admission: RE | Admit: 2022-07-28 | Discharge: 2022-07-28 | Disposition: A | Discharge status: Home / Self Care | Payer: Medicare Other | Source: Ambulatory Visit | Attending: Gastroenterology | Admitting: Gastroenterology

## 2022-07-28 DIAGNOSIS — R1310 Dysphagia, unspecified: Secondary | ICD-10-CM

## 2022-08-07 NOTE — Progress Notes (Signed)
GYNECOLOGY  VISIT   HPI: 67 y.o.   Married  Caucasian  female   G2P2002 with Patient's last menstrual period was 05/05/2010.   here for   U/S consult. She is followed for small uterine fibroids and right ovarian cysts.   Last pelvic US 08/15/21: Uterus 6.02 x 3.24 cm 2 intramural fibroids:  0.78 cm and 1.09 cm. EMS 2.58 mm.  Left ovary 2.3 x 1.41 x 0.87 cm.  Right ovary 3.49 x 2.1 x 1.78 cm.  2 small avascular cysts:  1.4 x 2.3 cm and 1.4 x 0.8 cm.   No significant change from prior. No free fluid.   CA125 12 on 08/15/21.  No vaginal bleeding.  Occasional twinge on the right side.   States she may consider surgery later this year.   Personal history of breast cancer.   GYNECOLOGIC HISTORY: Patient's last menstrual period was 05/05/2010. Contraception:  PMP Menopausal hormone therapy:  n/a Last mammogram:  11/27/21 Breast Density Category C, BI-RADS CAT 2 benign Last pap smear:   11/20/20 ASCUS: HR HPV neg, 12/29/17 neg: HR Hpv neg        OB History     Gravida  2   Para  2   Term  2   Preterm      AB      Living  2      SAB      IAB      Ectopic      Multiple      Live Births                 Patient Active Problem List   Diagnosis Date Noted   Family history of malignant neoplasm of digestive organs 08/21/2022   Gastroesophageal reflux disease 08/21/2022   Lower esophageal ring (Schatzki) 08/21/2022   Breast cancer, left 10/21/2021   Genetic testing 07/05/2021   Family history of colon cancer 06/23/2021   Family history of uterine cancer 06/23/2021   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   History of right breast cancer 01/10/2020   HTN (hypertension) 02/01/2012   Other and unspecified hyperlipidemia 02/01/2012    Past Medical History:  Diagnosis Date   Acid reflux    Breast cancer 05/12/2020   left breast   Cancer 2002   Breast cancer-Lobular carcinoma in situ   Family history of colon cancer 06/23/2021   Family  history of uterine cancer 06/23/2021   HTN (hypertension)    Hyperlipidemia    Hypertension    Osteopenia 07/2011   t score -1.1 FRAX 13%/0.4%   Personal history of radiation therapy    Rosacea    Schatzki's ring     Past Surgical History:  Procedure Laterality Date   BREAST BIOPSY Left 04/24/2021   Stereo High Risk    BREAST BIOPSY Left 01/30/2021   BREAST EXCISIONAL BIOPSY Right    BREAST EXCISIONAL BIOPSY Left 2018   BREAST LUMPECTOMY Left 04/25/2021   BREAST LUMPECTOMY WITH RADIOACTIVE SEED LOCALIZATION Left 07/22/2016   Procedure: LEFT BREAST LUMPECTOMY WITH RADIOACTIVE SEED LOCALIZATION;  Surgeon: Manus Rudd, MD;  Location: [facility];  Service: General;  Laterality: Left;   BREAST LUMPECTOMY WITH RADIOACTIVE SEED LOCALIZATION Left 04/25/2021   Procedure: LEFT BREAST LUMPECTOMY WITH RADIOACTIVE SEED LOCALIZATION;  Surgeon: Manus Rudd, MD;  Location: [facility];  Service: General;  Laterality: Left;   BREAST SURGERY  2004   excisional right breast surgery    CHOLECYSTECTOMY  2003  DILATATION & CURETTAGE/HYSTEROSCOPY WITH MYOSURE N/A 01/22/2021   Procedure: DILATATION & CURETTAGE/HYSTEROSCOPY WITH MYOSURE RESECTION OF ENDOMETERIAL POLYP;  Surgeon: Patton Salles, MD;  Location: [facility];  Service: Gynecology;  Laterality: N/A;    Current Outpatient Medications  Medication Sig Dispense Refill   Calcium Carb-Cholecalciferol 600-800 MG-UNIT TABS Take 1 tablet by mouth at bedtime.     cholecalciferol (VITAMIN D) 1000 units tablet Take 1,000 Units by mouth at bedtime.     famotidine (PEPCID) 20 MG tablet      losartan (COZAAR) 100 MG tablet Take 1 tablet by mouth daily.     LOVAZA 1 G capsule Take 2 g by mouth 2 (two) times daily.      MIRVASO 0.33 % GEL Apply 1 application topically daily. Applied to face daily in the morning.     Multiple Vitamin (MULTIVITAMIN) capsule Take 1 capsule by mouth daily.     omeprazole (PRILOSEC) 40 MG  capsule      XIIDRA 5 % SOLN      No current facility-administered medications for this visit.     ALLERGIES: Exemestane, Ace inhibitors, Anastrozole, and Dextromethorphan  Family History  Problem Relation Age of Onset   Uterine cancer Mother 21       uterine   Osteoporosis Mother    Dementia Mother    Kidney cancer Mother 13       RCC   Stroke Father 17   Diabetes Father    Hypertension Father    Heart disease Father        bypass   Cancer Father 71       parotid   Diabetes Brother        type 2   Colon cancer Maternal Uncle        x2 maternal uncles; dx 12s   Colon cancer Paternal Uncle        dx after 50   Melanoma Maternal Grandfather        mets; d. 13s   Thyroid cancer Paternal Grandfather        d. 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[employer], works PRN  Tobacco Use   Smoking status: Never   Smokeless tobacco: Never  Building services engineer Use: Never used  Substance and Sexual Activity   Alcohol use: Yes    Comment: Once a month   Drug use: Never   Sexual activity: Yes    Partners: Male    Birth control/protection: Post-menopausal    Comment: 1st intercourse 67 yo-1 partner  Other Topics Concern   Not on file  Social History Narrative   Not on file   Social Determinants of Health   Financial Resource Strain: Not on file  Food Insecurity: Not on file  Transportation Needs: Not on file  Physical Activity: Not on file  Stress: Not on file  Social Connections: Not on file  Intimate Partner Violence: Not on file    Review of Systems  All other systems reviewed and are negative.   PHYSICAL EXAMINATION:    BP  118/82 (BP Location: Left Arm, Patient Position:  Sitting, Cuff Size: Normal)   Pulse 65   Ht 5' 6.75" (1.695 m)   Wt 192 lb (87.1 kg)   LMP 05/05/2010   SpO2 96%   BMI 30.30 kg/m     General appearance: alert, cooperative and appears stated age   Pelvic: External genitalia:  no lesions              Urethra:  normal appearing urethra with no masses, tenderness or lesions              Bartholins and Skenes: normal                 Vagina: normal appearing vagina with normal color and discharge, no lesions              Cervix: no lesions                Bimanual Exam:  Uterus:  normal size, contour, position, consistency, mobility, non-tender              Adnexa: no mass, fullness, tenderness              Rectal exam: yes.  Confirms.              Anus:  normal sphincter tone, no lesions  Chaperone was present for exam:  Warren Lacymily F, CMA  Pelvic US  Uterus 5.81 x 4.45 x 3.51 cm.  2 fibroids:  0.75 cm, 0.73 cm.  EMS 2.55 mm.  Left ovary 1.45 x 0.88 x 1.10 cm.  Atrophic.  Right ovary 4.49 x 2.36 x 2.21 cm.  2 avascular simple cysts:  2.78 x 2.2 cm (was 23 mm) and 1.62 x 1.34 cm (was 14 mm).  No adnexal masses.  No free fluid.   ASSESSMENT  Hx breast cancer.  Negative genetic testing.  Right ovarian cysts, one of which is slightly large. I suspect a benign serous cystadenoma.  PLAN  Pelvic US images and report reviewed.  CA125. Next US in 3 months. Potential laparoscopic bilateral salpingo-oophorectomy with collection of pelvic washings discussed.  Patient understands that I am not doing surgery at this time and would make an appropriate referral.  34 min  total time was spent for this patient encounter, including preparation, face-to-face counseling with the patient, coordination of care, and documentation of the encounter.

## 2022-08-21 ENCOUNTER — Ambulatory Visit (INDEPENDENT_AMBULATORY_CARE_PROVIDER_SITE_OTHER): Payer: Medicare Other

## 2022-08-21 ENCOUNTER — Encounter: Payer: Self-pay | Admitting: Obstetrics and Gynecology

## 2022-08-21 ENCOUNTER — Ambulatory Visit (INDEPENDENT_AMBULATORY_CARE_PROVIDER_SITE_OTHER): Payer: Medicare Other | Admitting: Obstetrics and Gynecology

## 2022-08-21 VITALS — BP 118/82 | HR 65 | Ht 66.75 in | Wt 192.0 lb

## 2022-08-21 DIAGNOSIS — N83201 Unspecified ovarian cyst, right side: Secondary | ICD-10-CM

## 2022-08-21 DIAGNOSIS — K222 Esophageal obstruction: Secondary | ICD-10-CM | POA: Insufficient documentation

## 2022-08-21 DIAGNOSIS — K219 Gastro-esophageal reflux disease without esophagitis: Secondary | ICD-10-CM | POA: Insufficient documentation

## 2022-08-21 DIAGNOSIS — Z800 Family history of malignant neoplasm of digestive organs: Secondary | ICD-10-CM | POA: Insufficient documentation

## 2022-08-22 LAB — CA 125: CA 125: 11 U/mL (ref <35)

## 2022-10-27 ENCOUNTER — Other Ambulatory Visit: Payer: Self-pay | Admitting: Surgery

## 2022-10-27 DIAGNOSIS — Z853 Personal history of malignant neoplasm of breast: Secondary | ICD-10-CM

## 2022-11-12 NOTE — Progress Notes (Signed)
GYNECOLOGY  VISIT   HPI: 67 y.o.   Married  Caucasian  female   G2P2002 with Patient's last menstrual period was 05/05/2010.   here for ultrasound consult for small right ovarian cysts and uterine fibroids.  No bleeding.   Last pelvic US 08/21/22: Pelvic US  Uterus 5.81 x 4.45 x 3.51 cm.  2 fibroids:  0.75 cm, 0.73 cm.  EMS 2.55 mm.  Left ovary 1.45 x 0.88 x 1.10 cm.  Atrophic.  Right ovary 4.49 x 2.36 x 2.21 cm.  2 avascular simple cysts:  2.78 x 2.2 cm (was 23 mm) and 1.62 x 1.34 cm (was 14 mm).  No adnexal masses.  No free fluid.   CA125 11 on 08/21/22.   Personal hx breast carcinoma in situ.   GYNECOLOGIC HISTORY: Patient's last menstrual period was 05/05/2010. Contraception:  pmp  Menopausal hormone therapy:  none  Last mammogram:  01/20/22 density C bi-rads 1 neg  Last pap smear:   11/20/20 ASCUS HRHPV neg         OB History     Gravida  2   Para  2   Term  2   Preterm      AB      Living  2      SAB      IAB      Ectopic      Multiple      Live Births                 Patient Active Problem List   Diagnosis Date Noted   Family history of malignant neoplasm of digestive organs 08/21/2022   Gastroesophageal reflux disease 08/21/2022   Lower esophageal ring (Schatzki) 08/21/2022   Breast cancer, left (HCC) 10/21/2021   Genetic testing 07/05/2021   Family history of colon cancer 06/23/2021   Family history of uterine cancer 06/23/2021   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   History of right breast cancer 01/10/2020   HTN (hypertension) 02/01/2012   Other and unspecified hyperlipidemia 02/01/2012    Past Medical History:  Diagnosis Date   Acid reflux    Breast cancer (HCC) 05/12/2020   left breast   Cancer (HCC) 2002   Breast cancer-Lobular carcinoma in situ   Family history of colon cancer 06/23/2021   Family history of uterine cancer 06/23/2021   HTN (hypertension)    Hyperlipidemia    Hypertension     Osteopenia 07/2011   t score -1.1 FRAX 13%/0.4%   Personal history of radiation therapy    Rosacea    Schatzki's ring     Past Surgical History:  Procedure Laterality Date   BREAST BIOPSY Left 04/24/2021   Stereo High Risk    BREAST BIOPSY Left 01/30/2021   BREAST EXCISIONAL BIOPSY Right    BREAST EXCISIONAL BIOPSY Left 2018   BREAST LUMPECTOMY Left 04/25/2021   BREAST LUMPECTOMY WITH RADIOACTIVE SEED LOCALIZATION Left 07/22/2016   Procedure: LEFT BREAST LUMPECTOMY WITH RADIOACTIVE SEED LOCALIZATION;  Surgeon: Manus Rudd, MD;  Location: [facility];  Service: General;  Laterality: Left;   BREAST LUMPECTOMY WITH RADIOACTIVE SEED LOCALIZATION Left 04/25/2021   Procedure: LEFT BREAST LUMPECTOMY WITH RADIOACTIVE SEED LOCALIZATION;  Surgeon: Manus Rudd, MD;  Location: [facility];  Service: General;  Laterality: Left;   BREAST SURGERY  2004   excisional right breast surgery    CHOLECYSTECTOMY  2003   DILATATION & CURETTAGE/HYSTEROSCOPY WITH MYOSURE N/A 01/22/2021   Procedure: DILATATION &  CURETTAGE/HYSTEROSCOPY WITH MYOSURE RESECTION OF ENDOMETERIAL POLYP;  Surgeon: Patton Salles, MD;  Location: [facility];  Service: Gynecology;  Laterality: N/A;    Current Outpatient Medications  Medication Sig Dispense Refill   Calcium Carb-Cholecalciferol 600-800 MG-UNIT TABS Take 1 tablet by mouth at bedtime.     cholecalciferol (VITAMIN D) 1000 units tablet Take 1,000 Units by mouth at bedtime.     famotidine (PEPCID) 20 MG tablet      losartan (COZAAR) 100 MG tablet Take 1 tablet by mouth daily.     LOVAZA 1 G capsule Take 2 g by mouth 2 (two) times daily.      MIRVASO 0.33 % GEL Apply 1 application topically daily. Applied to face daily in the morning.     Multiple Vitamin (MULTIVITAMIN) capsule Take 1 capsule by mouth daily.     omeprazole (PRILOSEC) 40 MG capsule      No current facility-administered medications for this visit.     ALLERGIES:  Exemestane, Ace inhibitors, Anastrozole, and Dextromethorphan  Family History  Problem Relation Age of Onset   Uterine cancer Mother 75       uterine   Osteoporosis Mother    Dementia Mother    Kidney cancer Mother 49       RCC   Stroke Father 48   Diabetes Father    Hypertension Father    Heart disease Father        bypass   Cancer Father 19       parotid   Diabetes Brother        type 2   Colon cancer Maternal Uncle        x2 maternal uncles; dx 1s   Colon cancer Paternal Uncle        dx after 50   Melanoma Maternal Grandfather        mets; d. 4s   Thyroid cancer Paternal Grandfather        d. 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[employer], works PRN  Tobacco Use   Smoking status: Never   Smokeless tobacco: Never  Vaping Use   Vaping status: Never Used  Substance and Sexual Activity   Alcohol use: Yes    Comment: Once a month   Drug use: Never   Sexual activity: Yes    Partners: Male    Birth control/protection: Post-menopausal    Comment: 1st intercourse 51 yo-1 partner  Other Topics Concern   Not on file  Social History Narrative   Not on file   Social Determinants of Health   Financial Resource Strain: Not on file  Food Insecurity: Not on file  Transportation Needs: Not on file  Physical Activity: Not on file  Stress: Not on file  Social Connections: Unknown (09/22/2021)   [facility]   Social Network    Social Network: Not on file  Intimate Partner Violence: Unknown (08/14/2021)   Received from [facility]   HITS    Physically Hurt: Not on file    Insult or Talk Down To: Not on  file    Threaten Physical Harm: Not on file    Scream or Curse: Not  on file    Review of Systems  All other systems reviewed and are negative.   PHYSICAL EXAMINATION:    BP 128/84 (BP Location: Left Arm, Patient Position: Sitting, Cuff Size: Normal)   Pulse 61   Ht 5' 6.75" (1.695 m)   Wt 191 lb (86.6 kg)   LMP 05/05/2010   SpO2 98%   BMI 30.14 kg/m     General appearance: alert, cooperative and appears stated age   Pelvic US Uterus 6.61 x 4.87 x. 3.12 cm. 2 fibroids:  1.34 cm and 1.17 cm. EMS 2.29 mm.  Left ovary 2.16 x 1.05 x 0.89 cm.  Atrophic.   Right ovary 4.41 x 2.55 x 2.12 cm.  2 cysts 2.15 cm (Slightly smaller, single septation.) and 1.42 cm.  (No change.) No free fluid.   ASSESSMENT  Right ovarian cysts, stable.  Normal CA125.  Small fibroids.  Hx breast ductal carcinoma in situ.  Negative genetic testing.   PLAN  Pelvic ultrasound images and report reviewed with patient.  Comparisons done with prior ultrasounds with the patient today, showing stability over time.  We agreed on observational management with yearly pelvic ultrasounds based on the low likelihood of precancerous or cancerous change.  No surgical intervention recommended at this time.  Pelvic US in July, 2025.  Breast and pelvic exam and pap in September, 2025.   23 min  total time was spent for this patient encounter, including preparation, face-to-face counseling with the patient, coordination of care, and documentation of the encounter.

## 2022-11-20 ENCOUNTER — Encounter: Payer: Self-pay | Admitting: Obstetrics and Gynecology

## 2022-11-20 ENCOUNTER — Ambulatory Visit (INDEPENDENT_AMBULATORY_CARE_PROVIDER_SITE_OTHER): Payer: Medicare Other

## 2022-11-20 ENCOUNTER — Ambulatory Visit (INDEPENDENT_AMBULATORY_CARE_PROVIDER_SITE_OTHER): Payer: Medicare Other | Admitting: Obstetrics and Gynecology

## 2022-11-20 VITALS — BP 128/84 | HR 61 | Ht 66.75 in | Wt 191.0 lb

## 2022-11-20 DIAGNOSIS — N83201 Unspecified ovarian cyst, right side: Secondary | ICD-10-CM | POA: Diagnosis not present

## 2022-11-20 DIAGNOSIS — D251 Intramural leiomyoma of uterus: Secondary | ICD-10-CM | POA: Diagnosis not present

## 2022-12-04 ENCOUNTER — Ambulatory Visit
Admission: RE | Admit: 2022-12-04 | Discharge: 2022-12-04 | Disposition: A | Discharge status: Home / Self Care | Payer: Medicare Other | Source: Ambulatory Visit | Attending: Surgery | Admitting: Surgery

## 2022-12-04 ENCOUNTER — Other Ambulatory Visit: Payer: Self-pay | Admitting: Surgery

## 2022-12-04 DIAGNOSIS — D0512 Intraductal carcinoma in situ of left breast: Secondary | ICD-10-CM

## 2022-12-04 DIAGNOSIS — Z853 Personal history of malignant neoplasm of breast: Secondary | ICD-10-CM

## 2023-01-20 ENCOUNTER — Ambulatory Visit
Admission: RE | Admit: 2023-01-20 | Discharge: 2023-01-20 | Disposition: A | Discharge status: Home / Self Care | Payer: Medicare Other | Source: Ambulatory Visit | Attending: Surgery | Admitting: Surgery

## 2023-01-20 DIAGNOSIS — D0512 Intraductal carcinoma in situ of left breast: Secondary | ICD-10-CM

## 2023-01-20 MED ORDER — GADOPICLENOL 0.5 MMOL/ML IV SOLN
9.0000 mL | Freq: Once | INTRAVENOUS | Status: AC | PRN
  Administered 2023-01-20: 9 mL via INTRAVENOUS

## 2023-02-10 NOTE — Progress Notes (Signed)
Please call the patient and let them know that their MRI appeared normal.  Repeat MRI and mammogram in 1 year

## 2023-08-10 ENCOUNTER — Other Ambulatory Visit: Payer: Self-pay | Admitting: Obstetrics and Gynecology

## 2023-08-10 DIAGNOSIS — N83201 Unspecified ovarian cyst, right side: Secondary | ICD-10-CM

## 2023-08-10 NOTE — Progress Notes (Signed)
 Order for pelvic ultrasound for right ovarian cysts.

## 2023-08-11 HISTORY — PX: MOHS SURGERY: SUR867

## 2023-09-18 IMAGING — MR MR BREAST BX W LOC DEV 1ST LESION IMAGE BX SPEC MR GUIDE*L*
6 of 8 series · 32 of 48 positions shown · IV contrast (7 ml gadavist)
Comparison: Previous exams.
COMPARISON: Previous exams.

Addendum:
CLINICAL DATA: Patient presents for MR guided core biopsy of LEFT
breast.

EXAM:
MRI GUIDED CORE NEEDLE BIOPSY OF THE LEFT BREAST
TECHNIQUE: Multiplanar, multisequence MR imaging of the LEFT breast was
performed both before and after administration of intravenous
contrast.
CONTRAST:  7 ml Gadavist

[Series 2: fiducial unilateral · sagittal · 2.0mm · 1.33mm/px · 1 of 52 slices shown]
[im 1/52]
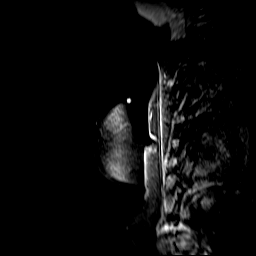

[Series 3: dynamic pre · axial · non-contrast · 1.3mm · 0.73mm/px · z∈[-74,+112]mm · 6 of 144 slices shown]
[im 1/144]
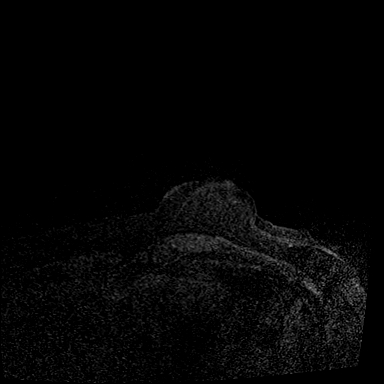
[im 29/144]
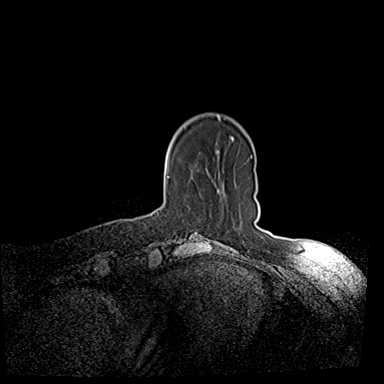
[im 58/144]
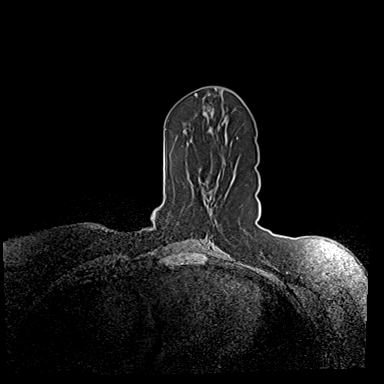
[im 86/144]
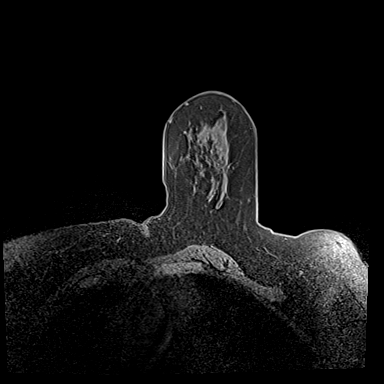
[im 115/144]
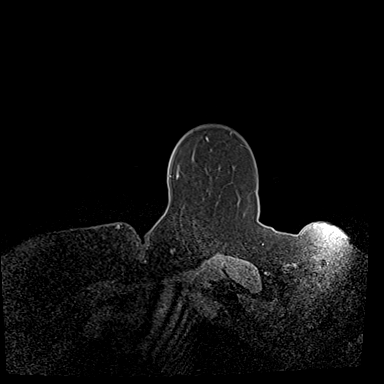
[im 144/144]
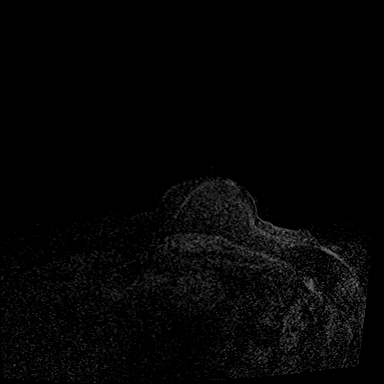

[Series 4: dynamic post 20 · axial · 1.3mm · 0.73mm/px · z∈[-74,+112]mm · 6 of 144 slices shown (1 of 2)]
[im 1/144]
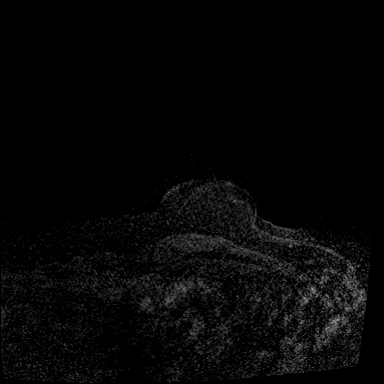
[im 29/144]
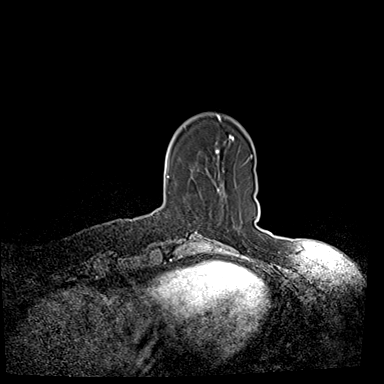
[im 58/144]
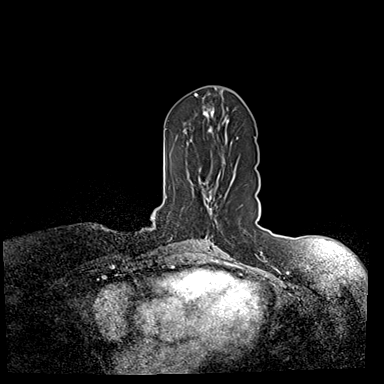
[im 86/144]
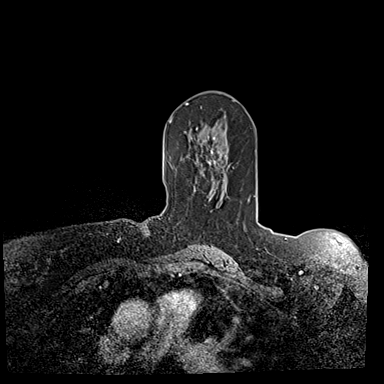
[im 115/144]
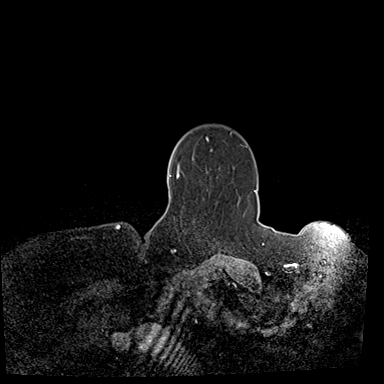
[im 144/144]
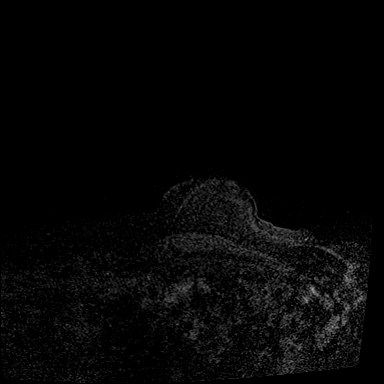

[Series 5: dynamic post 20 · axial · 1.3mm · 0.73mm/px · z∈[-74,+112]mm · 7 of 144 slices shown (2 of 2)]
[im 1/144]
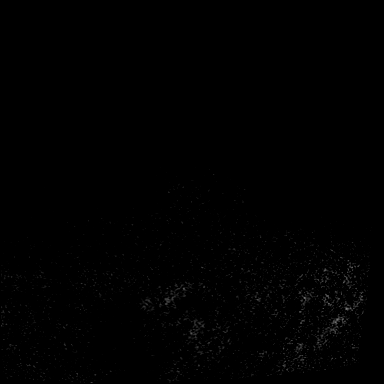
[im 24/144]
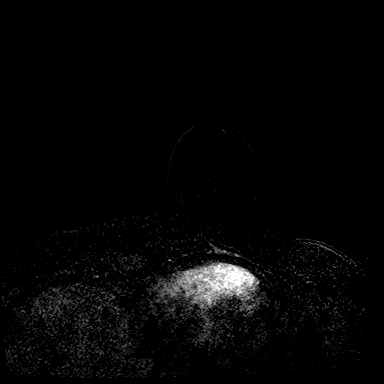
[im 48/144]
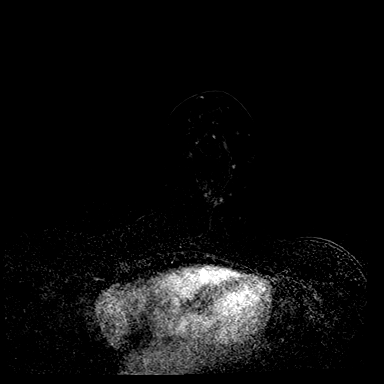
[im 72/144]
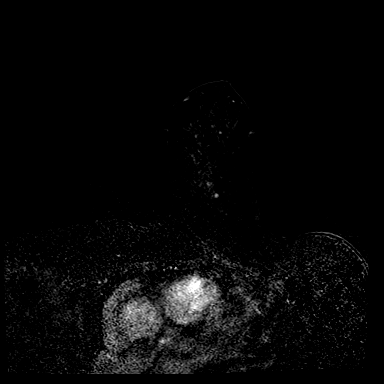
[im 96/144]
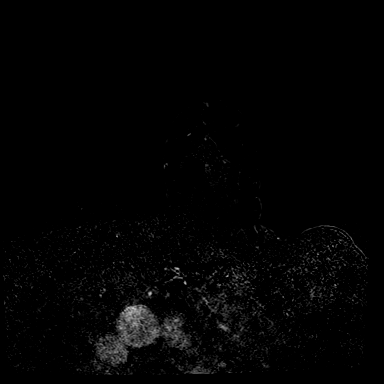
[im 120/144]
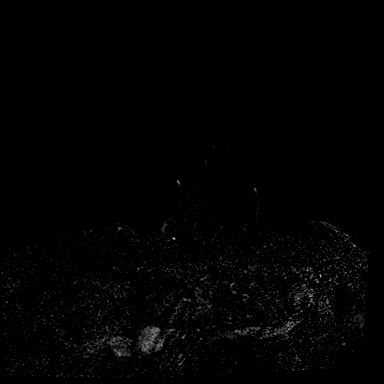
[im 144/144]
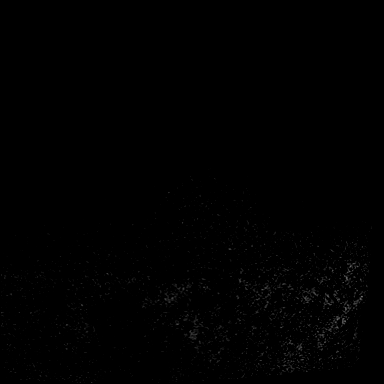

[Series 6: needle confirmation · axial · 1.3mm · 0.73mm/px · z∈[-74,+112]mm · 7 of 144 slices shown]
[im 1/144]
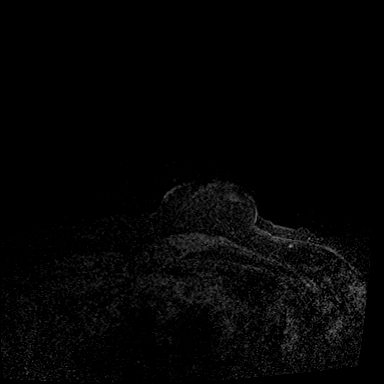
[im 24/144]
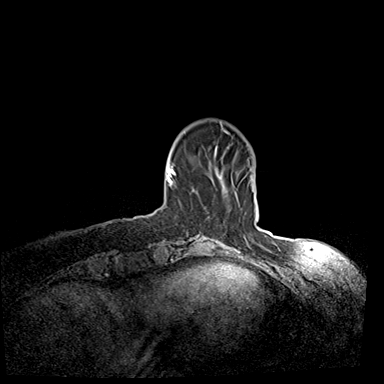
[im 48/144]
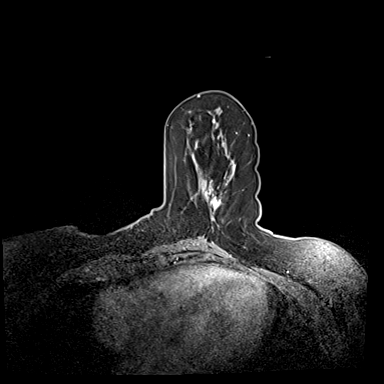
[im 72/144]
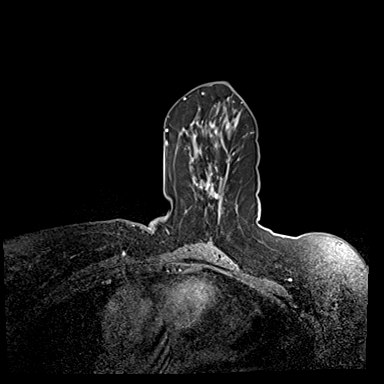
[im 96/144]
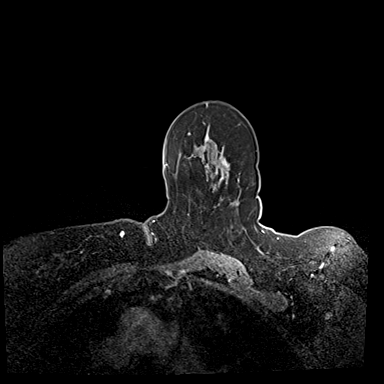
[im 120/144]
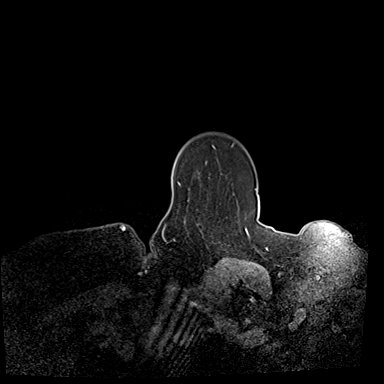
[im 144/144]
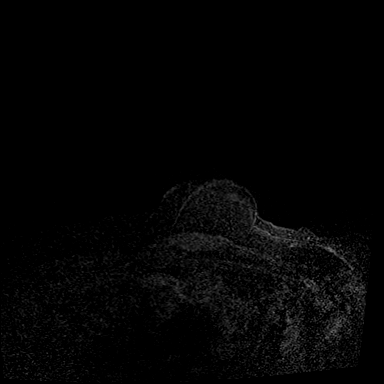

[Series 7: needle confirmation_sub · axial · 1.3mm · 0.73mm/px · z∈[-74,+49]mm · 5 of 144 slices shown]
[im 1/144]
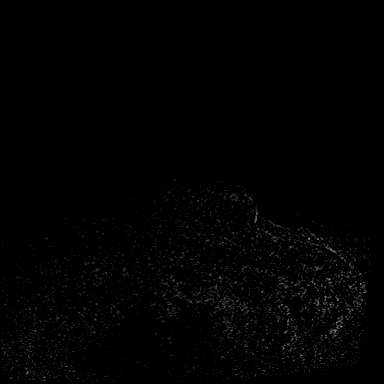
[im 24/144]
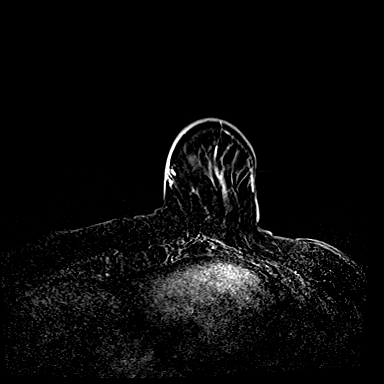
[im 48/144]
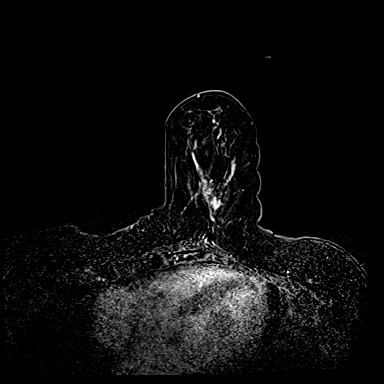
[im 72/144]
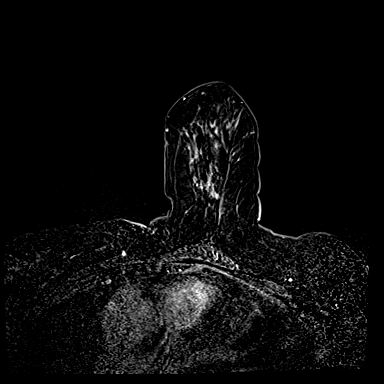
[im 96/144]
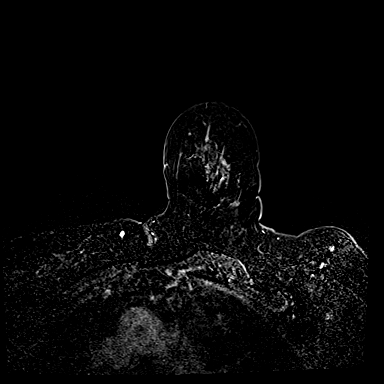

[32 of 48 positions shown; findings below may reference images not displayed]

FINDINGS: I met with the patient, and we discussed the procedure of MRI guided
biopsy, including risks, benefits, and alternatives. Specifically,
we discussed the risks of infection, bleeding, tissue injury, clip
migration, and inadequate sampling. Informed, written consent was
given. The usual time out protocol was performed immediately prior
to the procedure.

Using sterile technique, 1% Lidocaine, MRI guidance, and a 9 gauge
vacuum assisted device, biopsy was performed of non mass enhancement
in the LOWER central LEFT breast using a LATERAL approach. At the
conclusion of the procedure, a barbell tissue marker clip was
deployed into the biopsy cavity. Follow-up 2-view mammogram was
performed and dictated separately.
IMPRESSION: MRI guided biopsy of non mass enhancement in the LOWER central LEFT
breast. No apparent complications.

ADDENDUM:
Pathology revealed ATYPICAL DUCTAL HYPERPLASIA, FIBROCYSTIC CHANGE
AND USUAL DUCTAL HYPERPLASIA of the LEFT breast, lower, central
enhancement, barbell clip. This was found to be concordant by Dr.
Jenushka Merita, with surgical consultation for consideration of
excision recommended.

Pathology results were discussed with the patient by telephone. The
patient reported doing well after the biopsy with tenderness at the
site. Post biopsy instructions and care were reviewed and questions
were answered. The patient was encouraged to call The [REDACTED]

The patient was instructed to return for bilateral diagnostic
mammography on February 12, 2021 and informed a reminder notice would
be sent regarding this appointment.

Surgical consultation was made to [REDACTED] on
January 31, 2021. Follow up will be arranged with Dr. Kezumo
Madie.

Pathology results reported by Abast Waggie RN on 02/01/2021.

*** End of Addendum ***
FINDINGS: I met with the patient, and we discussed the procedure of MRI guided
biopsy, including risks, benefits, and alternatives. Specifically,
we discussed the risks of infection, bleeding, tissue injury, clip
migration, and inadequate sampling. Informed, written consent was
given. The usual time out protocol was performed immediately prior
to the procedure.

Using sterile technique, 1% Lidocaine, MRI guidance, and a 9 gauge
vacuum assisted device, biopsy was performed of non mass enhancement
in the LOWER central LEFT breast using a LATERAL approach. At the
conclusion of the procedure, a barbell tissue marker clip was
deployed into the biopsy cavity. Follow-up 2-view mammogram was
performed and dictated separately.
IMPRESSION: MRI guided biopsy of non mass enhancement in the LOWER central LEFT
breast. No apparent complications.

## 2023-09-18 IMAGING — MG MM BREAST LOCALIZATION CLIP
4 series · 4 of 12 positions shown · non-contrast
Comparison: Previous exam(s).

CLINICAL DATA: Status post MR guided core biopsy of non mass
enhancement in the LOWER central LEFT breast.

EXAM:
3D DIAGNOSTIC LEFT MAMMOGRAM POST MRI BIOPSY

[L ML synth-2D]
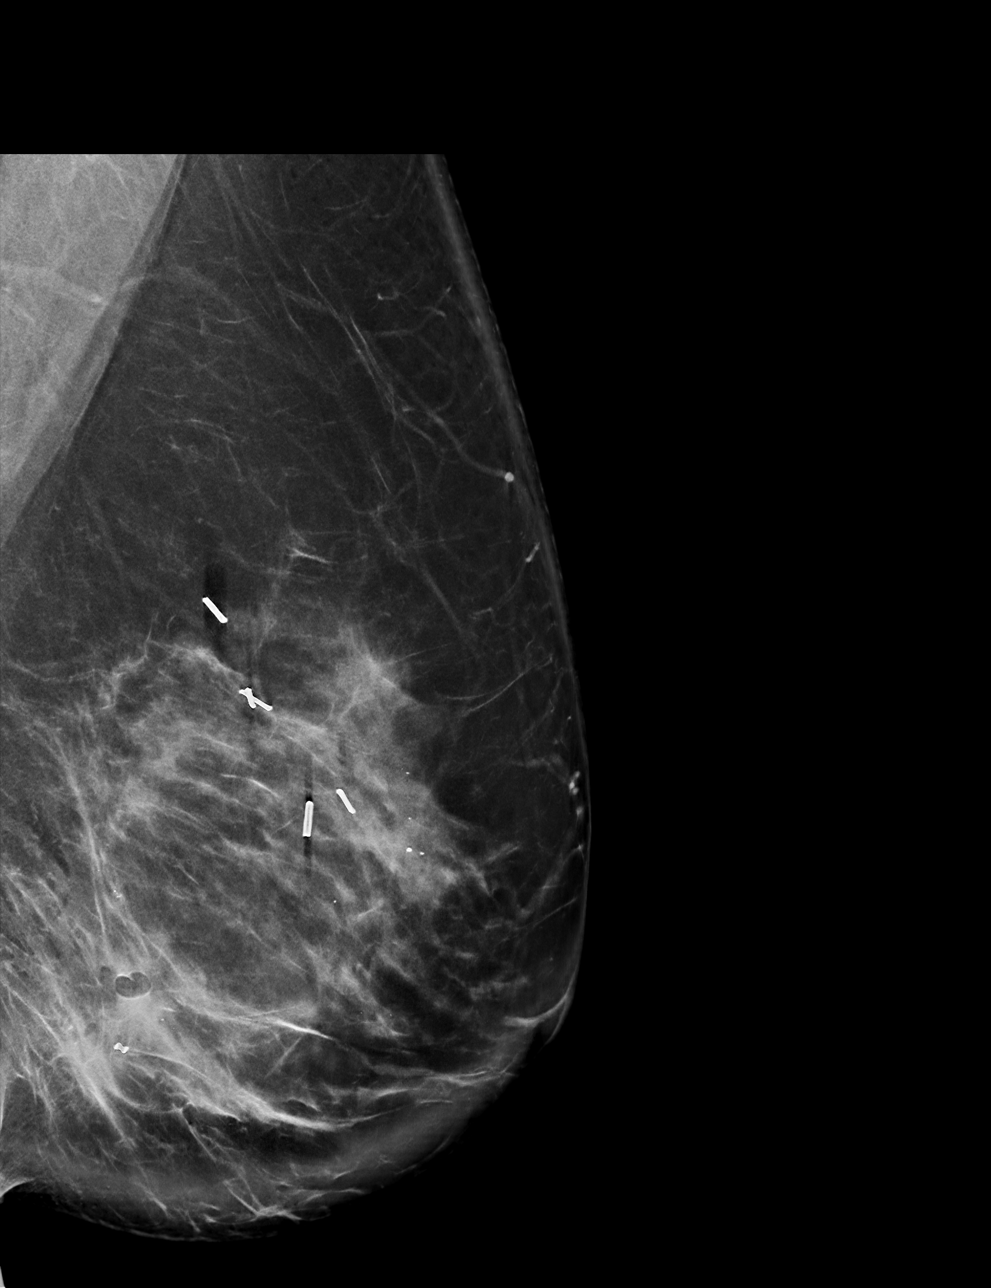

[L CC synth-2D]
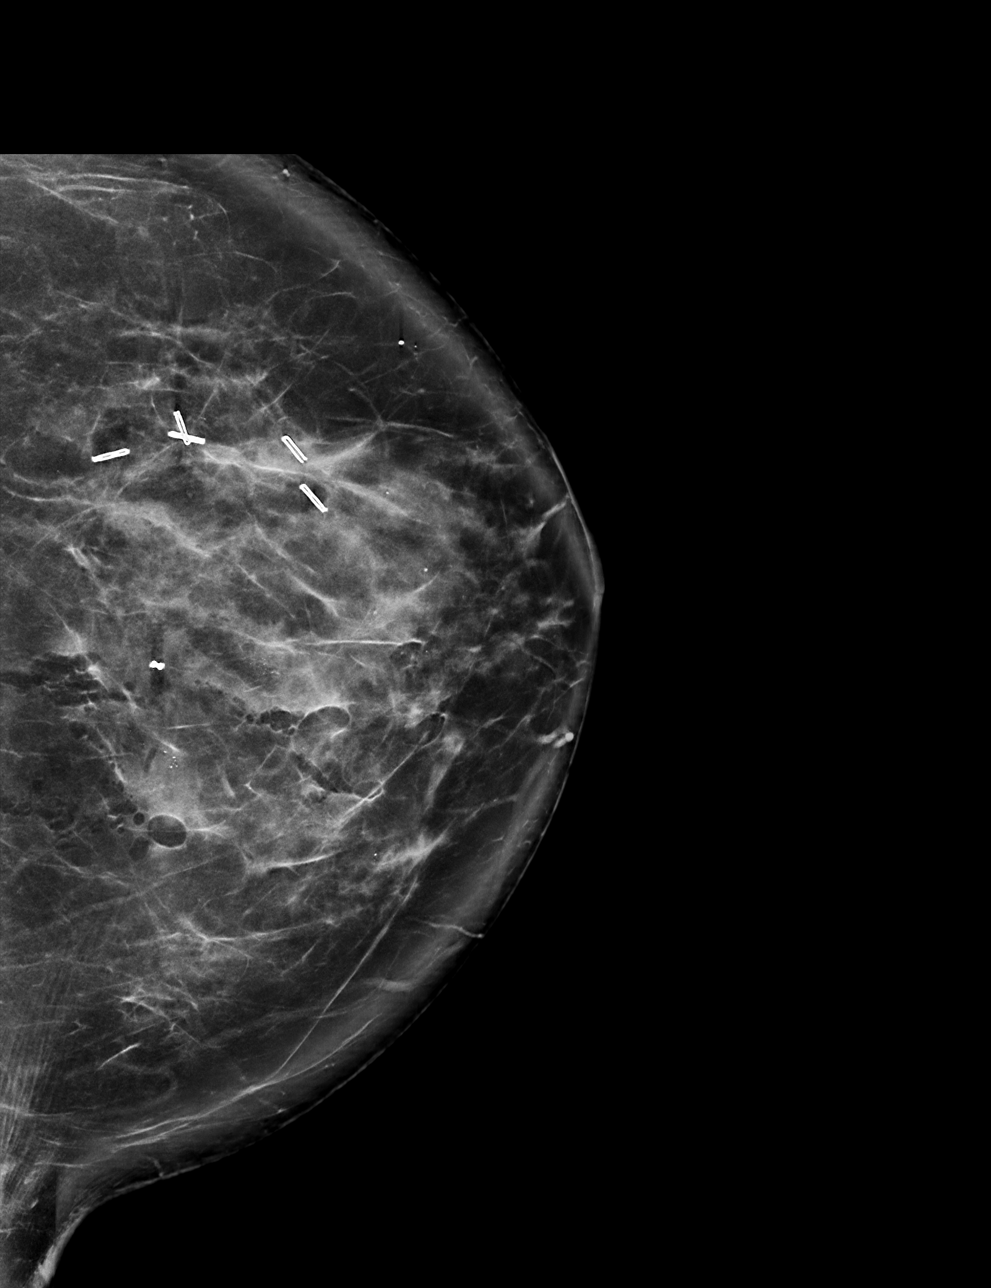

[L CC tomo · tomo slice 47/94.0]
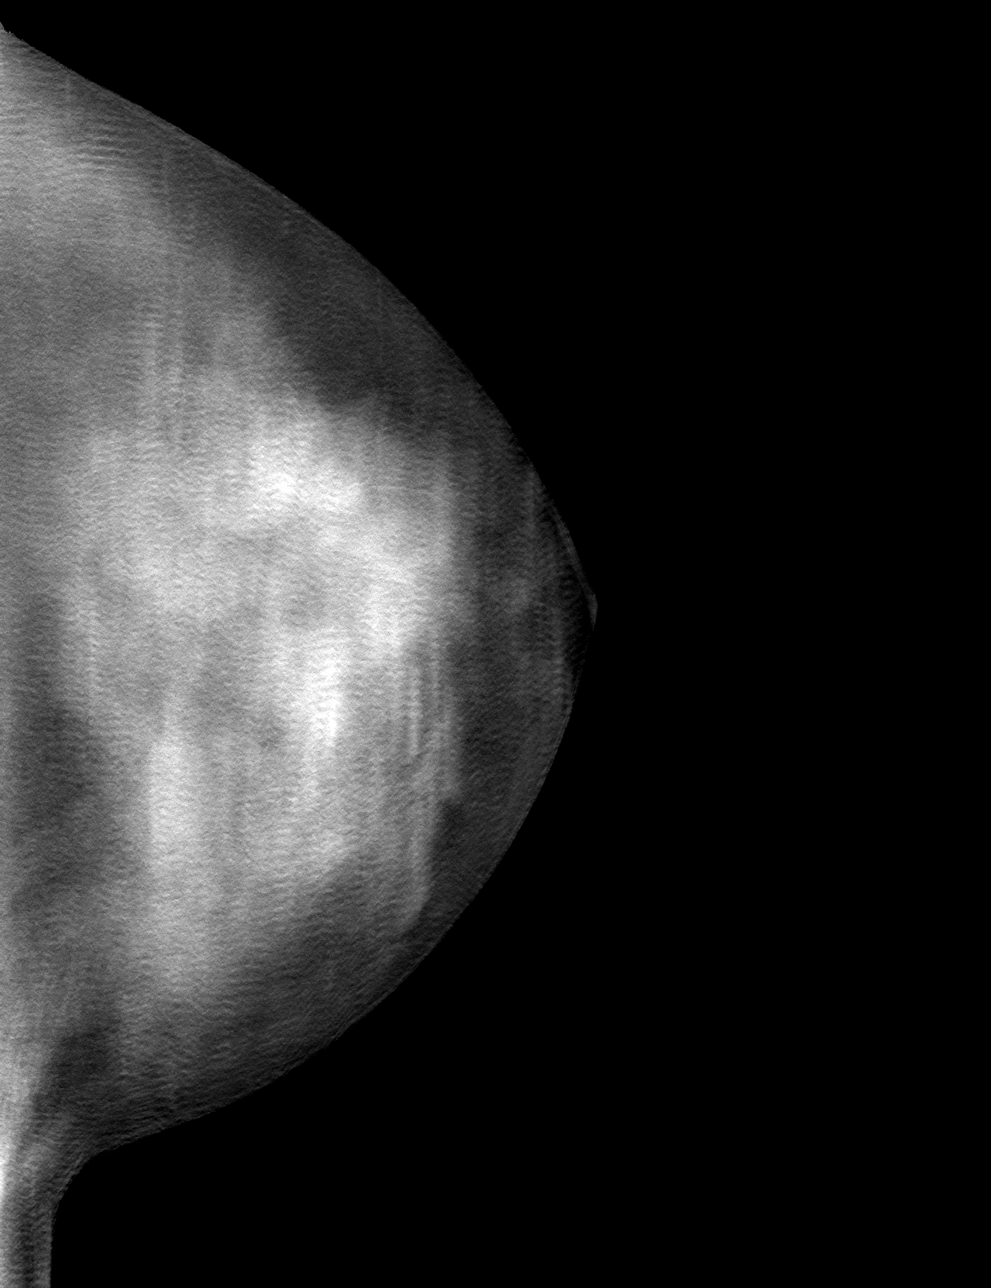

[L ML tomo · tomo slice 55/108.0]
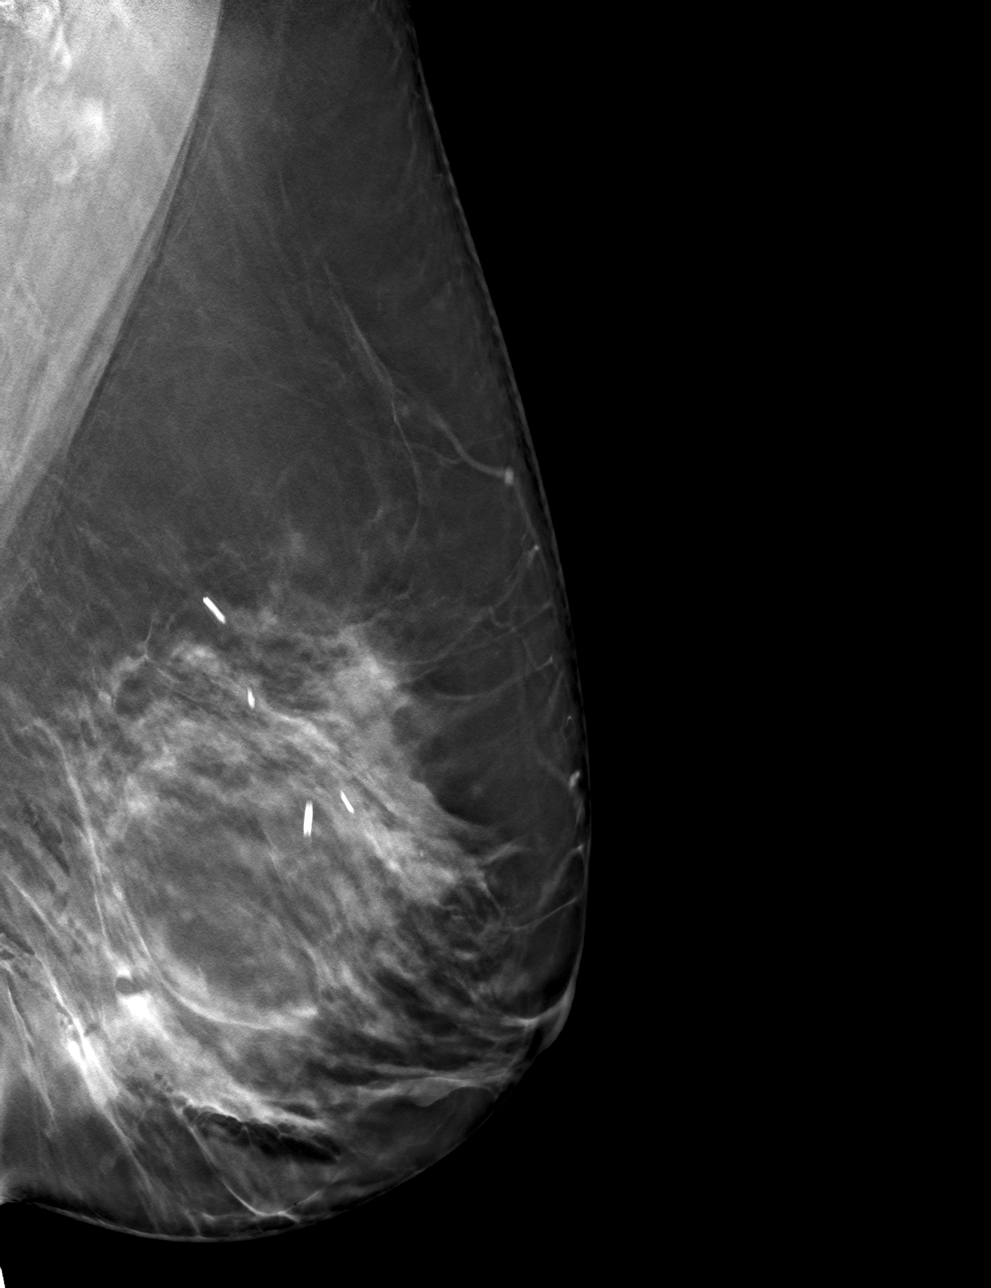

[4 of 12 positions shown; findings below may reference images not displayed]

FINDINGS: 3D Mammographic images were obtained following MRI guided biopsy of
non mass enhancement in the LOWER central LEFT breast and placement
of a barbell shaped clip. The biopsy marking clip is in expected
position at the site of biopsy.
IMPRESSION: Appropriate positioning of the barbell shaped biopsy marking clip at
the site of biopsy in the LOWER central LEFT breast.

Final Assessment: Post Procedure Mammograms for Marker Placement

## 2023-11-03 ENCOUNTER — Other Ambulatory Visit: Payer: Self-pay | Admitting: Surgery

## 2023-11-03 DIAGNOSIS — Z1231 Encounter for screening mammogram for malignant neoplasm of breast: Secondary | ICD-10-CM

## 2023-11-16 NOTE — Progress Notes (Unsigned)
 GYNECOLOGY  VISIT   HPI: 68 y.o.   Married  Caucasian female   G2P2002 with Patient's last menstrual period was 05/05/2010.   here for: u/s consult      Patient is followed for small right ovarian cysts and uterine fibroids.  No pain or bleeding.   Last US  done 11/20/22: Uterus 6.61 x 4.87 x. 3.12 cm. 2 fibroids:  1.34 cm and 1.17 cm. EMS 2.29 mm.  Left ovary 2.16 x 1.05 x 0.89 cm.  Atrophic.   Right ovary 4.41 x 2.55 x 2.12 cm.  2 cysts 2.15 cm (Slightly smaller, single septation.) and 1.42 cm.  (No change.) No free fluid.   CA125 11 on 08/21/22.   Personal hx breast carcinoma in situ.  Went to [place] for 2 weeks.   Working prn.    GYNECOLOGIC HISTORY: Patient's last menstrual period was 05/05/2010. Contraception:  PMP Menopausal hormone therapy:  n/a Last 2 paps:  11/20/20 ASCUS, HR HPV neg, 12/29/17 neg  History of abnormal Pap or positive HPV:  yes Mammogram:  12/04/22 Breast Density Cat C, BIRADS Cat 2 benign         OB History     Gravida  2   Para  2   Term  2   Preterm      AB      Living  2      SAB      IAB      Ectopic      Multiple      Live Births                 Patient Active Problem List   Diagnosis Date Noted   Family history of malignant neoplasm of digestive organs 08/21/2022   Gastroesophageal reflux disease 08/21/2022   Lower esophageal ring (Schatzki) 08/21/2022   Breast cancer, left (HCC) 10/21/2021   Genetic testing 07/05/2021   Family history of colon cancer 06/23/2021   Family history of uterine cancer 06/23/2021   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   History of right breast cancer 01/10/2020   HTN (hypertension) 02/01/2012   Other and unspecified hyperlipidemia 02/01/2012    Past Medical History:  Diagnosis Date   Acid reflux    Breast cancer (HCC) 05/12/2020   left breast   Cancer (HCC) 2002   Breast cancer-Lobular carcinoma in situ   Family history of colon cancer 06/23/2021    Family history of uterine cancer 06/23/2021   HTN (hypertension)    Hyperlipidemia    Hypertension    Osteopenia 07/2011   t score -1.1 FRAX 13%/0.4%   Personal history of radiation therapy    Rosacea    Schatzki's ring     Past Surgical History:  Procedure Laterality Date   BREAST BIOPSY Left 04/24/2021   Stereo High Risk    BREAST BIOPSY Left 01/30/2021   BREAST EXCISIONAL BIOPSY Right    BREAST EXCISIONAL BIOPSY Left 2018   BREAST LUMPECTOMY Left 04/25/2021   BREAST LUMPECTOMY WITH RADIOACTIVE SEED LOCALIZATION Left 07/22/2016   Procedure: LEFT BREAST LUMPECTOMY WITH RADIOACTIVE SEED LOCALIZATION;  Surgeon: Donnice Lima, MD;  Location: [facility];  Service: General;  Laterality: Left;   BREAST LUMPECTOMY WITH RADIOACTIVE SEED LOCALIZATION Left 04/25/2021   Procedure: LEFT BREAST LUMPECTOMY WITH RADIOACTIVE SEED LOCALIZATION;  Surgeon: Lima Donnice, MD;  Location: [facility];  Service: General;  Laterality: Left;   BREAST SURGERY  2004   excisional right breast surgery  CHOLECYSTECTOMY  2003   DILATATION & CURETTAGE/HYSTEROSCOPY WITH MYOSURE N/A 01/22/2021   Procedure: DILATATION & CURETTAGE/HYSTEROSCOPY WITH MYOSURE RESECTION OF ENDOMETERIAL POLYP;  Surgeon: Cathlyn JAYSON Nikki Bobie FORBES, MD;  Location: [facility];  Service: Gynecology;  Laterality: N/A;   MOHS SURGERY  08/2023    Current Outpatient Medications  Medication Sig Dispense Refill   CEQUA 0.09 % SOLN Apply 1 drop to eye 2 (two) times daily.     cholecalciferol (VITAMIN D ) 1000 units tablet Take 1,000 Units by mouth at bedtime.     losartan  (COZAAR ) 100 MG tablet Take 1 tablet by mouth daily.     LOVAZA 1 G capsule Take 2 g by mouth 2 (two) times daily.      MIRVASO 0.33 % GEL Apply 1 application topically daily. Applied to face daily in the morning.     Multiple Vitamin (MULTIVITAMIN) capsule Take 1 capsule by mouth daily.     omeprazole (PRILOSEC) 40 MG capsule      No current  facility-administered medications for this visit.     ALLERGIES: Exemestane , Ace inhibitors, Anastrozole , and Dextromethorphan  Family History  Problem Relation Age of Onset   Uterine cancer Mother 42       uterine   Osteoporosis Mother    Dementia Mother    Kidney cancer Mother 55       RCC   Stroke Father 6   Diabetes Father    Hypertension Father    Heart disease Father        bypass   Cancer Father 69       parotid   Diabetes Brother        type 2   Colon cancer Maternal Uncle        x2 maternal uncles; dx 69s   Colon cancer Paternal Uncle        dx after 50   Melanoma Maternal Grandfather        mets; d. 18s   Thyroid cancer Paternal Grandfather        d. 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[employer], works PRN  Tobacco Use   Smoking status: Never   Smokeless tobacco: Never  Vaping Use   Vaping status: Never Used  Substance and Sexual Activity   Alcohol  use: Yes    Comment: Once a month   Drug use: Never   Sexual activity: Yes    Partners: Male    Birth control/protection: Post-menopausal    Comment: 1st intercourse 68 yo-1 partner  Other Topics Concern   Not on file  Social History Narrative   Not on file   Social Drivers of Health   Financial Resource Strain: Not on file  Food Insecurity: Not on file  Transportation Needs: Not on file  Physical Activity: Not on file  Stress: Not on file  Social Connections: Unknown (09/22/2021)   [facility]   Social Network    Social Network: Not on file  Intimate Partner Violence: Unknown (08/14/2021)   [facility]   HITS    Physically  Hurt: Not on file    Insult or Talk Down To: Not on file     Threaten Physical Harm: Not on file    Scream or Curse: Not on file    Review of Systems  All other systems reviewed and are negative.   PHYSICAL EXAMINATION:   BP 118/80 (BP Location: Left Arm, Patient Position: Sitting)   Pulse 73   LMP 05/05/2010   SpO2 97%     General appearance: alert, cooperative and appears stated age Head: Normocephalic, without obvious abnormality, atraumatic Neck: no adenopathy, supple, symmetrical, trachea midline and thyroid normal to inspection and palpation Lungs: clear to auscultation bilaterally Breasts: normal appearance, no masses or tenderness, No nipple retraction or dimpling, No nipple discharge or bleeding, No axillary or supraclavicular adenopathy Heart: regular rate and rhythm Abdomen: soft, non-tender, no masses,  no organomegaly Extremities: extremities normal, atraumatic, no cyanosis or edema Skin: Skin color, texture, turgor normal. No rashes or lesions Lymph nodes: Cervical, supraclavicular, and axillary nodes normal. No abnormal inguinal nodes palpated Neurologic: Grossly normal  Pelvic: External genitalia:  no lesions              Urethra:  normal appearing urethra with no masses, tenderness or lesions              Bartholins and Skenes: normal                 Vagina: normal appearing vagina with normal color and discharge, no lesions              Cervix: no lesions                Bimanual Exam:  Uterus:  normal size, contour, position, consistency, mobility, non-tender              Adnexa: no mass, fullness, tenderness              Rectal exam: {yes no:[id number]}.  Confirms.              Anus:  normal sphincter tone, no lesions  Chaperone was present for exam:  {BSCHAPERONE:[id number]::Emily F, CMA}  ASSESSMENT:  Right ovarian cysts, one with slight increase in size.  Normal CA125.  Small fibroids. Noting 4 instead of 2.   Hx endometrial polyp.  Hx breast ductal carcinoma in situ.  Family history of uterine cancer in mother.   Negative genetic testing.   PLAN:  Check CA125. Will consider laparoscopic BSO versus laparoscopic hysterectomy with BSO.   ***  total time was spent for this patient encounter, including preparation, face-to-face counseling with the patient, coordination of care, and documentation of the encounter.

## 2023-11-17 ENCOUNTER — Ambulatory Visit

## 2023-11-17 ENCOUNTER — Ambulatory Visit (INDEPENDENT_AMBULATORY_CARE_PROVIDER_SITE_OTHER): Admitting: Obstetrics and Gynecology

## 2023-11-17 ENCOUNTER — Encounter: Payer: Self-pay | Admitting: Obstetrics and Gynecology

## 2023-11-17 VITALS — BP 118/80 | HR 73

## 2023-11-17 DIAGNOSIS — D219 Benign neoplasm of connective and other soft tissue, unspecified: Secondary | ICD-10-CM

## 2023-11-17 DIAGNOSIS — N83201 Unspecified ovarian cyst, right side: Secondary | ICD-10-CM | POA: Diagnosis not present

## 2023-11-17 NOTE — Patient Instructions (Addendum)
 Hysterectomy Information  A hysterectomy is a surgery to take out the uterus. The uterus is where a baby grows when a person is pregnant. Other organs may also be taken out. They are: The organs that make eggs (ovaries). The tubes that move the egg to the uterus (fallopian tubes). The lowest part of the uterus (cervix). After the surgery, you'll no longer have your period. You'll not be able to get pregnant. This surgery can affect the way you feel. Talk with your health care provider about the physical and emotional effects of this surgery. Why is a hysterectomy done? This surgery may be done if: You have growths in the uterus, such as fibroids. This is the most common reason. The lining of the uterus grows outside the uterus. Your uterus has moved down and bulges down into the vagina. You have abnormal or heavy bleeding during your period. You have pain in the pelvic area, and the pain lasts a long time. You have cancer of the uterus or cervix. What are the risks of hysterectomy? Your provider will talk with you about risks. These may include: Bleeding or blood clots in the legs or lung. Infection. Injury to areas close to the uterus. These include the nerves, bladder, or bowel. Reaction to the medicines used. Early symptoms of menopause, if both ovaries are taken out. These include hot flashes, vaginal dryness, night sweats, and lack of sleep. What can be taken out during a hysterectomy? This surgery can be done: To take out the top part of the uterus only. The cervix is not taken out. To take out the uterus and cervix. To take out the uterus, the cervix, and the tissue that holds the uterus. In some cases, your ovaries may also be taken out. The parts that will be taken out are based on the reason why you need this surgery. What are types of this surgery? The uterus can be taken out in many ways. Talk with your provider about the best surgery for you.  Here are the ones that are  done most often. There are benefits and risks for each. Abdominal hysterectomy One big cut is made in your belly. The uterus and other organs are taken out through this cut. This method is used: When your provider wants a better way to get to your uterus. If you have scars inside your belly that stick to other parts or organs. These are called adhesions. If you have this surgery: You may take longer to get well because of the big cut in your belly. There's a higher risk of injury to other tissues and organs. You may get adhesions. Vaginal hysterectomy Cuts are made in the top of the vagina. No cuts are made on your skin. The uterus is taken out through the vagina. If you have this surgery: You may have a shorter stay in the hospital. There's a lower risk of getting adhesions. You may have fewer problems after surgery. Laparoscopic-assisted vaginal hysterectomy (LAVH) Many small cuts are made in your belly and inside your vagina. LAVH is done with long, thin tools and cameras. Robots are also used. LAVH takes longer. There's also higher risk of injury to other organs. But there's less bleeding. If you have LAVH: There's less risk that germs will get into your body. You'll have a short stay in the hospital. You'll go back to your normal activities sooner. What happens after the surgery? You will be given pain medicine. You may need to stay in the hospital  for 1-2 days. You may need to have an adult stay with you for a few days after you go home. You will not be able to lift heavy objects. You will not be able to put anything in your vagina for the first 6 weeks or for the time told by your provider. This includes douching, having sex, and using tampons. If your ovaries were taken out, you may get hot flashes. You may also have night sweats, vaginal dryness, and trouble sleeping. Questions to ask your health care provider Is this surgery needed? What other options do I have? What organs  need to be taken out? How long will I need to stay in the hospital? How long will I need to get better at home? What symptoms can I expect after the surgery? This information is not intended to replace advice given to you by your health care provider. Make sure you discuss any questions you have with your health care provider. Document Revised: 01/29/2023 Document Reviewed: 08/15/2022 Elsevier Patient Education  2024 [employer].

## 2023-11-18 ENCOUNTER — Ambulatory Visit: Payer: Self-pay | Admitting: Obstetrics and Gynecology

## 2023-11-18 LAB — CA 125: CA 125: 13 U/mL (ref <35)

## 2023-12-14 ENCOUNTER — Ambulatory Visit
Admission: RE | Admit: 2023-12-14 | Discharge: 2023-12-14 | Disposition: A | Discharge status: Home / Self Care | Source: Ambulatory Visit | Attending: Surgery | Admitting: Surgery

## 2023-12-14 DIAGNOSIS — Z1231 Encounter for screening mammogram for malignant neoplasm of breast: Secondary | ICD-10-CM

## 2023-12-16 ENCOUNTER — Other Ambulatory Visit: Payer: Self-pay | Admitting: Surgery

## 2023-12-16 ENCOUNTER — Ambulatory Visit: Payer: Self-pay | Admitting: Obstetrics and Gynecology

## 2023-12-16 DIAGNOSIS — R928 Other abnormal and inconclusive findings on diagnostic imaging of breast: Secondary | ICD-10-CM

## 2023-12-21 ENCOUNTER — Ambulatory Visit
Admission: RE | Admit: 2023-12-21 | Discharge: 2023-12-21 | Disposition: A | Discharge status: Home / Self Care | Source: Ambulatory Visit | Attending: Surgery | Admitting: Surgery

## 2023-12-21 ENCOUNTER — Ambulatory Visit: Payer: Self-pay | Admitting: Obstetrics and Gynecology

## 2023-12-21 DIAGNOSIS — R928 Other abnormal and inconclusive findings on diagnostic imaging of breast: Secondary | ICD-10-CM

## 2023-12-25 ENCOUNTER — Other Ambulatory Visit: Payer: Self-pay | Admitting: Surgery

## 2023-12-25 DIAGNOSIS — D0512 Intraductal carcinoma in situ of left breast: Secondary | ICD-10-CM

## 2024-01-15 ENCOUNTER — Encounter: Payer: Self-pay | Admitting: Obstetrics and Gynecology

## 2024-01-15 NOTE — Progress Notes (Unsigned)
 68 y.o. G76P2002 Married Caucasian female here for a breast and pelvic exam.    The patient is followed for right ovarian cyst that is slowly increasing in size.    One episode of potential bleeding once since her dilation and curettage in September 2022.  This spotting was in April 2023.  Her endometrium was normal at the ultrasound that followed this.  No EMB was done at that time.  No further bleeding or spotting.   Last pelvic US  11/17/23: Uterus 6.64 x 4.37 x 3.25 cm.  Fibroids:  intramural 0.62 cm, 1.10 cm, 0.56 cm, 0.74 cm.  (Previously had 2 fibroids noted.) EMS 3.35 mm.  Left ovary 1.93 x 1.59 x 1.68 cm.  Atrophic.  Right ovary 4.19 x 2.84 x 3.08 cm.   Cyst 28 x 24 mm with thin septation, avascular.  (Slight increase in size.) Cyst 15 x 13 mm simple, avascular.  Stable in size.  No adnexal masses.  No free fluid.   CA125 13 on 11/17/23.    Patient would like to have hysterectomy with bilateral salpingo-oohorectomy in February. She would like to definitive treatment and avoid continued pelvic ultrasounds   Recent UTI treated with Macrobid by PCP.   Working prn only.   FH mother with uterine cancer.   PCP: Loreli Elsie JONETTA Mickey., MD   Patient's last menstrual period was 05/05/2010.           Sexually active: Yes.    The current method of family planning is post menopausal status.    Menopausal hormone therapy:  n/a Exercising: Yes.    Walking Smoker:  no  OB History     Gravida  2   Para  2   Term  2   Preterm      AB      Living  2      SAB      IAB      Ectopic      Multiple      Live Births              HEALTH MAINTENANCE: Last 2 paps: 11/20/20 ASCUS, HR HPV neg, 12/29/17 neg History of abnormal Pap or positive HPV:  yes Mammogram:  12/14/23 incomplete, need additional imaging. 12/21/23 right breast US  - BI-RADS2.  MRI 01/17/24 Breast Density Cat 2 benign  Colonoscopy:  03/16/20 Bone Density:  12/06/19  Result  normal     There is no  immunization history on file for this patient.    reports that she has never smoked. She has never used smokeless tobacco. She reports current alcohol  use. She reports that she does not use drugs.  Past Medical History:  Diagnosis Date   Acid reflux    Breast cancer (HCC) 05/12/2020   left breast   Cancer (HCC) 2002   Breast cancer-Lobular carcinoma in situ   Family history of colon cancer 06/23/2021   Family history of uterine cancer 06/23/2021   HTN (hypertension)    Hyperlipidemia    Hypertension    Osteopenia 07/2011   t score -1.1 FRAX 13%/0.4%   Personal history of radiation therapy    Rosacea    Schatzki's ring     Past Surgical History:  Procedure Laterality Date   BREAST BIOPSY Left 04/24/2021   Stereo High Risk    BREAST BIOPSY Left 01/30/2021   BREAST EXCISIONAL BIOPSY Right    BREAST EXCISIONAL BIOPSY Left 2018   BREAST LUMPECTOMY Left 04/25/2021   BREAST  LUMPECTOMY WITH RADIOACTIVE SEED LOCALIZATION Left 07/22/2016   Procedure: LEFT BREAST LUMPECTOMY WITH RADIOACTIVE SEED LOCALIZATION;  Surgeon: Donnice Lima, MD;  Location: [facility];  Service: General;  Laterality: Left;   BREAST LUMPECTOMY WITH RADIOACTIVE SEED LOCALIZATION Left 04/25/2021   Procedure: LEFT BREAST LUMPECTOMY WITH RADIOACTIVE SEED LOCALIZATION;  Surgeon: Lima Donnice, MD;  Location: [facility];  Service: General;  Laterality: Left;   BREAST SURGERY  2004   excisional right breast surgery    CHOLECYSTECTOMY  2003   DILATATION & CURETTAGE/HYSTEROSCOPY WITH MYOSURE N/A 01/22/2021   Procedure: DILATATION & CURETTAGE/HYSTEROSCOPY WITH MYOSURE RESECTION OF ENDOMETERIAL POLYP;  Surgeon: Cathlyn JAYSON Nikki Bobie FORBES, MD;  Location: [facility];  Service: Gynecology;  Laterality: N/A;   MOHS SURGERY  08/2023    Current Outpatient Medications  Medication Sig Dispense Refill   CEQUA 0.09 % SOLN Apply 1 drop to eye 2 (two) times daily.     cholecalciferol (VITAMIN D ) 1000  units tablet Take 1,000 Units by mouth at bedtime.     losartan  (COZAAR ) 100 MG tablet Take 1 tablet by mouth daily.     LOVAZA 1 G capsule Take 2 g by mouth 2 (two) times daily.      MIRVASO 0.33 % GEL Apply 1 application topically daily. Applied to face daily in the morning.     Multiple Vitamin (MULTIVITAMIN) capsule Take 1 capsule by mouth daily.     omeprazole (PRILOSEC) 40 MG capsule      No current facility-administered medications for this visit.    ALLERGIES: Exemestane , Ace inhibitors, Anastrozole , and Dextromethorphan  Family History  Problem Relation Age of Onset   Uterine cancer Mother 65       uterine   Osteoporosis Mother    Dementia Mother    Kidney cancer Mother 33       RCC   Stroke Father 41   Diabetes Father    Hypertension Father    Heart disease Father        bypass   Cancer Father 62       parotid   Diabetes Brother        type 2   Colon cancer Maternal Uncle        x2 maternal uncles; dx 64s   Colon cancer Paternal Uncle        dx after 50   Melanoma Maternal Grandfather        mets; d. 82s   Thyroid cancer Paternal Grandfather        d. late 38s   Diabetes Son 40       MODY   Asthma Son     Review of Systems  All other systems reviewed and are negative.   PHYSICAL EXAM:  BP 116/80 (BP Location: Left Arm, Patient Position: Sitting)   Pulse 71   Ht 5' 7.5 (1.715 m)   Wt 186 lb (84.4 kg)   LMP 05/05/2010   SpO2 96%   BMI 28.70 kg/m     General appearance: alert, cooperative and appears stated age Head: normocephalic, without obvious abnormality, atraumatic Neck: no adenopathy, supple, symmetrical, trachea midline and thyroid normal to inspection and palpation Lungs: clear to auscultation bilaterally Breasts: normal appearance, no masses or tenderness, No nipple retraction or dimpling, No nipple discharge or bleeding, No axillary adenopathy Heart: regular rate and rhythm Abdomen: soft, non-tender; no masses, no  organomegaly Extremities: extremities normal, atraumatic, no cyanosis or edema Skin: skin color, texture, turgor normal. No  rashes or lesions Lymph nodes: cervical, supraclavicular, and axillary nodes normal. Neurologic: grossly normal  Pelvic: External genitalia:  no lesions              No abnormal inguinal nodes palpated.              Urethra:  normal appearing urethra with no masses, tenderness or lesions              Bartholins and Skenes: normal                 Vagina: normal appearing vagina with normal color and discharge, no lesions              Cervix: no lesions              Pap taken: yes Bimanual Exam:  Uterus:  normal size, contour, position, consistency, mobility, non-tender              Adnexa: no mass, fullness, tenderness              Rectal exam: yes.  Confirms.              Anus:  normal sphincter tone, no lesions  Chaperone was present for exam:  Kari HERO, CMA  ASSESSMENT: Encounter for breast and pelvic exam.  Personal history of other medical treatment. ASCUS pap, neg HR HPV. 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  Ductal carcinoma in situ left breast.  Status post lumpectomy and XRT.   Status post hysteroscopic polypectomy 2022.  Small uterine fibroids.  Right ovarian cysts.  Avascular.  Slightly increasing in size on US .  Normal CA125.  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 FH uterine cancer in mother.  Negative genetic testing.   PLAN: Mammogram screening discussed. Self breast awareness reviewed. Pap and HRV collected:  yes Guidelines for Calcium, Vitamin D , regular exercise program including cardiovascular and weight bearing exercise. Medication refills:  NA Follow up:  yearly.  We discussed laparoscopic hysterectomy with bilateral salpingo-oophorectomy.  Will make surgical referral.     Additional counseling given.  yes. 30 min  total time was spent for this patient encounter, including preparation, face-to-face counseling with the patient,  coordination of care, and documentation of the encounter in addition to doing the breast and pelvic exam and pap.

## 2024-01-17 ENCOUNTER — Ambulatory Visit
Admission: RE | Admit: 2024-01-17 | Discharge: 2024-01-17 | Disposition: A | Discharge status: Home / Self Care | Source: Ambulatory Visit | Attending: Surgery | Admitting: Surgery

## 2024-01-17 DIAGNOSIS — D0512 Intraductal carcinoma in situ of left breast: Secondary | ICD-10-CM

## 2024-01-17 MED ORDER — GADOPICLENOL 0.5 MMOL/ML IV SOLN
8.0000 mL | Freq: Once | INTRAVENOUS | Status: AC | PRN
  Administered 2024-01-17: 8 mL via INTRAVENOUS

## 2024-01-20 ENCOUNTER — Other Ambulatory Visit (HOSPITAL_COMMUNITY)
Admission: RE | Admit: 2024-01-20 | Discharge: 2024-01-20 | Disposition: A | Discharge status: Home / Self Care | Source: Ambulatory Visit | Attending: Obstetrics and Gynecology | Admitting: Obstetrics and Gynecology

## 2024-01-20 ENCOUNTER — Encounter: Payer: Self-pay | Admitting: Obstetrics and Gynecology

## 2024-01-20 ENCOUNTER — Ambulatory Visit: Admitting: Obstetrics and Gynecology

## 2024-01-20 VITALS — BP 116/80 | HR 71 | Ht 67.5 in | Wt 186.0 lb

## 2024-01-20 DIAGNOSIS — D0512 Intraductal carcinoma in situ of left breast: Secondary | ICD-10-CM | POA: Diagnosis not present

## 2024-01-20 DIAGNOSIS — Z124 Encounter for screening for malignant neoplasm of cervix: Secondary | ICD-10-CM

## 2024-01-20 DIAGNOSIS — Z9289 Personal history of other medical treatment: Secondary | ICD-10-CM

## 2024-01-20 DIAGNOSIS — Z01419 Encounter for gynecological examination (general) (routine) without abnormal findings: Secondary | ICD-10-CM

## 2024-01-20 DIAGNOSIS — Z1151 Encounter for screening for human papillomavirus (HPV): Secondary | ICD-10-CM | POA: Insufficient documentation

## 2024-01-20 DIAGNOSIS — Z8742 Personal history of other diseases of the female genital tract: Secondary | ICD-10-CM | POA: Diagnosis present

## 2024-01-20 DIAGNOSIS — N83201 Unspecified ovarian cyst, right side: Secondary | ICD-10-CM | POA: Diagnosis not present

## 2024-01-20 NOTE — Patient Instructions (Signed)

## 2024-01-22 LAB — CYTOLOGY - PAP
Comment: NEGATIVE
Diagnosis: NEGATIVE
High risk HPV: NEGATIVE

## 2024-01-26 ENCOUNTER — Ambulatory Visit: Payer: Self-pay | Admitting: Obstetrics and Gynecology

## 2024-02-24 ENCOUNTER — Ambulatory Visit: Admitting: Obstetrics and Gynecology

## 2024-02-24 VITALS — BP 118/60 | HR 68 | Ht 66.93 in | Wt 189.0 lb

## 2024-02-24 DIAGNOSIS — N83201 Unspecified ovarian cyst, right side: Secondary | ICD-10-CM

## 2024-02-24 DIAGNOSIS — D251 Intramural leiomyoma of uterus: Secondary | ICD-10-CM

## 2024-02-24 DIAGNOSIS — D219 Benign neoplasm of connective and other soft tissue, unspecified: Secondary | ICD-10-CM

## 2024-02-24 DIAGNOSIS — C50011 Malignant neoplasm of nipple and areola, right female breast: Secondary | ICD-10-CM

## 2024-02-24 DIAGNOSIS — E2839 Other primary ovarian failure: Secondary | ICD-10-CM

## 2024-02-24 DIAGNOSIS — Z8742 Personal history of other diseases of the female genital tract: Secondary | ICD-10-CM | POA: Diagnosis not present

## 2024-02-24 NOTE — Progress Notes (Signed)
 HPI: 68 y.o.   Married  Caucasian female   G2P2002 with Patient's last menstrual period was 05/05/2010.   here for: u/s consult      Patient is here today for surgical consult from Dr. Nikki. Per Dr. Nikki note:  followed for right ovarian cysts and uterine fibroids.  No pain or bleeding.   Last US  done 11/20/22: Uterus 6.61 x 4.87 x. 3.12 cm. 2 fibroids:  1.34 cm and 1.17 cm. EMS 2.29 mm.  Left ovary 2.16 x 1.05 x 0.89 cm.  Atrophic.   Right ovary 4.41 x 2.55 x 2.12 cm.  2 cysts 2.15 cm (Slightly smaller, single septation.) and 1.42 cm.  (No change.) No free fluid.   CA125 11 on 08/21/22.   Personal hx bilateral breast cancer.  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    She has been given all options with Dr. Nikki and would like to have the RLH/BSO, cystoscopy  GYNECOLOGIC HISTORY: Patient's last menstrual period was 05/05/2010. Contraception:  PMP Menopausal hormone therapy:  n/a Last 2 paps:  11/20/20 ASCUS, HR HPV neg, 12/29/17 neg  History of abnormal Pap or positive HPV:  yes Mammogram:  12/04/22 Breast Density Cat C, BIRADS Cat 2 benign  DXA: 2021 normal Colonoscopy: 2021 repeat in 5 years.  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   History of right breast cancer 01/10/2020   HTN (hypertension) 02/01/2012    Other and unspecified hyperlipidemia 02/01/2012    Past Medical History:  Diagnosis Date   Acid reflux    Breast cancer (HCC) 05/12/2020   left breast   Cancer (HCC) 2002   Breast cancer-Lobular carcinoma in situ   Family history of colon cancer 06/23/2021   Family history of uterine cancer 06/23/2021   HTN (hypertension)    Hyperlipidemia    Hypertension    Osteopenia 07/2011   t score -1.1 FRAX 13%/0.4%   Personal history of radiation therapy    Rosacea    Schatzki's ring     Past Surgical History:  Procedure Laterality Date   BREAST BIOPSY Left 04/24/2021   Stereo High Risk    BREAST BIOPSY Left 01/30/2021   BREAST EXCISIONAL BIOPSY Right    BREAST EXCISIONAL BIOPSY Left 2018   BREAST LUMPECTOMY Left 04/25/2021   BREAST LUMPECTOMY WITH RADIOACTIVE SEED LOCALIZATION Left 07/22/2016   Procedure: LEFT BREAST LUMPECTOMY WITH RADIOACTIVE SEED LOCALIZATION;  Surgeon: Donnice Lima, MD;  Location: [facility];  Service: General;  Laterality:  Left;   BREAST LUMPECTOMY WITH RADIOACTIVE SEED LOCALIZATION Left 04/25/2021   Procedure: LEFT BREAST LUMPECTOMY WITH RADIOACTIVE SEED LOCALIZATION;  Surgeon: Belinda Cough, MD;  Location: [facility];  Service: General;  Laterality: Left;   BREAST SURGERY  2004   excisional right breast surgery    CHOLECYSTECTOMY  2003   DILATATION & CURETTAGE/HYSTEROSCOPY WITH MYOSURE N/A 01/22/2021   Procedure: DILATATION & CURETTAGE/HYSTEROSCOPY WITH MYOSURE RESECTION OF ENDOMETERIAL POLYP;  Surgeon: Cathlyn JAYSON Nikki Bobie FORBES, MD;  Location: [facility];  Service: Gynecology;  Laterality: N/A;   MOHS SURGERY  08/2023    Current Outpatient Medications  Medication Sig Dispense Refill   CEQUA 0.09 % SOLN Apply 1 drop to eye 2 (two) times daily.     cholecalciferol (VITAMIN D ) 1000 units tablet Take 1,000 Units by mouth at bedtime.     losartan  (COZAAR ) 100 MG tablet Take 1 tablet by mouth daily.     LOVAZA 1 G capsule Take 2  g by mouth 2 (two) times daily.      MIRVASO 0.33 % GEL Apply 1 application topically daily. Applied to face daily in the morning.     Multiple Vitamin (MULTIVITAMIN) capsule Take 1 capsule by mouth daily.     omeprazole (PRILOSEC) 40 MG capsule      No current facility-administered medications for this visit.     ALLERGIES: Exemestane , Ace inhibitors, Anastrozole , and Dextromethorphan  Family History  Problem Relation Age of Onset   Uterine cancer Mother 5       uterine   Osteoporosis Mother    Dementia Mother    Kidney cancer Mother 41       RCC   Stroke Father 51   Diabetes Father    Hypertension Father    Heart disease Father        bypass   Cancer Father 58       parotid   Diabetes Brother        type 2   Colon cancer Maternal Uncle        x2 maternal uncles; dx 42s   Colon cancer Paternal Uncle        dx after 50   Melanoma Maternal Grandfather        mets; d. 60s   Thyroid cancer Paternal Grandfather        d. late 33s   Diabetes Son 14       MODY   Asthma Son     Social History   Socioeconomic History   Marital status: Married    Spouse name: Not on file   Number of children: 2   Years of education: Not on file   Highest education level: Not on file  Occupational History   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[employer], works PRN  Tobacco Use   Smoking status: Never   Smokeless tobacco: Never  Vaping Use   Vaping status: Never Used  Substance and Sexual Activity   Alcohol  use: Yes    Comment: Once a month   Drug use: Never   Sexual activity: Yes    Partners: Male    Birth control/protection: Post-menopausal    Comment: 1st intercourse 20 yo-1 partner, hx of breast cancer  Other Topics Concern   Not on file  Social History Narrative   Not on file   Social Drivers of Health   Financial Resource Strain: Not on file  Food Insecurity: Not on file  Transportation  Needs: Not on file  Physical Activity: Not on file  Stress: Not on  file  Social Connections: Unknown (09/22/2021)   [facility]   Social Network    Social Network: Not on file  Intimate Partner Violence: Unknown (08/14/2021)   Received from [facility]   HITS    Physically Hurt: Not on file    Insult or Talk Down To: Not on file    Threaten Physical Harm: Not on file    Scream or Curse: Not on file    Review of Systems  All other systems reviewed and are negative.   PHYSICAL EXAMINATION:   BP 118/60   Pulse 68   Ht 5' 6.93 (1.7 m)   Wt 189 lb (85.7 kg)   LMP 05/05/2010   SpO2 97%   BMI 29.66 kg/m     General appearance: alert, cooperative and appears stated age  Pelvic US  Uterus 6.64 x 4.37 x 3.25 cm.  Fibroids:  intramural 0.62 cm, 1.10 cm, 0.56 cm, 0.74 cm.  (Previously had 2 fibroids noted.) EMS 3.35 mm.  Left ovary 1.93 x 1.59 x 1.68 cm.  Atrophic.  Right ovary 4.19 x 2.84 x 3.08 cm.   Cyst 28 x 24 mm with thin septation, avascular.  (Slight increase in size.) Cyst 15 x 13 mm simple, avascular.  Stable in size.  No adnexal masses.  No free fluid.    ASSESSMENT:  Right ovarian cysts, one with slight increase in size.  Normal CA125.  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 Small fibroids.  Detecting 4 instead of 2.   Hx endometrial polyp.  Hx left breast DCIS.  Hx right breast lobular carcinoma in situ. Family history of uterine cancer in mother.  Negative genetic testing.  Endometrial polyp with h/o D&C  PLAN:  Symptomatic fibroid uterus with enlarging ovarian cyst in menopause:  Counseled on all options.  She would like to have the RLH/BSO with cystoscopy.  Counseled extensively on the procedure including but not limited to what to expect and risks and benefits.  Counseled on postop care and pelvic rest for 10 weeks after the surgery with restricted lifting for 6 weeks after.  Counseled on the benefits of the robotic procedure with faster return to daily activities, improved outcomes, and less risk for  complications. She would like to have this scheduled.   30 minutes spent on reviewing records, imaging,  and one on one patient time and counseling patient and documentation Dr. Glennon

## 2024-04-19 ENCOUNTER — Ambulatory Visit (HOSPITAL_BASED_OUTPATIENT_CLINIC_OR_DEPARTMENT_OTHER)
Admission: RE | Admit: 2024-04-19 | Discharge: 2024-04-19 | Disposition: A | Source: Ambulatory Visit | Attending: Obstetrics and Gynecology | Admitting: Obstetrics and Gynecology

## 2024-04-19 ENCOUNTER — Ambulatory Visit: Payer: Self-pay | Admitting: Obstetrics and Gynecology

## 2024-04-19 DIAGNOSIS — E2839 Other primary ovarian failure: Secondary | ICD-10-CM

## 2024-06-21 ENCOUNTER — Encounter: Admitting: Obstetrics and Gynecology

## 2024-07-05 ENCOUNTER — Ambulatory Visit (HOSPITAL_COMMUNITY): Admit: 2024-07-05 | Admitting: Obstetrics and Gynecology

## 2024-07-05 SURGERY — HYSTERECTOMY, TOTAL, LAPAROSCOPIC, WITH BILATERAL SALPINGO-OOPHORECTOMY
Anesthesia: General

## 2025-01-24 ENCOUNTER — Encounter: Admitting: Obstetrics and Gynecology
